# Patient Record
Sex: Male | Born: 1960 | Race: White | Hispanic: No | Marital: Married | Smoking: Never smoker
Health system: Southern US, Community
[De-identification: ages and names within clinical notes are randomized; demographics above are authoritative.]

## PROBLEM LIST (undated history)

## (undated) DIAGNOSIS — G43909 Migraine, unspecified, not intractable, without status migrainosus: Secondary | ICD-10-CM

## (undated) DIAGNOSIS — N400 Enlarged prostate without lower urinary tract symptoms: Secondary | ICD-10-CM

## (undated) DIAGNOSIS — I10 Essential (primary) hypertension: Secondary | ICD-10-CM

## (undated) DIAGNOSIS — L309 Dermatitis, unspecified: Secondary | ICD-10-CM

## (undated) HISTORY — DX: Dermatitis, unspecified: L30.9

## (undated) HISTORY — DX: Migraine, unspecified, not intractable, without status migrainosus: G43.909

## (undated) HISTORY — DX: Benign prostatic hyperplasia without lower urinary tract symptoms: N40.0

## (undated) HISTORY — DX: Essential (primary) hypertension: I10

---

## 2004-11-24 HISTORY — PX: OTHER SURGICAL HISTORY: SHX169

## 2005-11-24 HISTORY — PX: WISDOM TOOTH EXTRACTION: SHX21

## 2007-10-29 ENCOUNTER — Ambulatory Visit: Payer: Self-pay | Admitting: Family Medicine

## 2008-03-30 ENCOUNTER — Ambulatory Visit: Payer: Self-pay | Admitting: Family Medicine

## 2008-05-01 ENCOUNTER — Ambulatory Visit: Payer: Self-pay | Admitting: Family Medicine

## 2008-11-08 ENCOUNTER — Ambulatory Visit: Payer: Self-pay | Admitting: Family Medicine

## 2009-08-15 ENCOUNTER — Ambulatory Visit: Payer: Self-pay | Admitting: Family Medicine

## 2010-10-21 ENCOUNTER — Ambulatory Visit: Payer: Self-pay | Admitting: Family Medicine

## 2011-02-24 ENCOUNTER — Ambulatory Visit (INDEPENDENT_AMBULATORY_CARE_PROVIDER_SITE_OTHER): Payer: BC Managed Care – PPO | Admitting: Family Medicine

## 2011-02-24 DIAGNOSIS — N410 Acute prostatitis: Secondary | ICD-10-CM

## 2011-04-09 ENCOUNTER — Telehealth: Payer: Self-pay | Admitting: Family Medicine

## 2011-04-10 MED ORDER — SULFAMETHOXAZOLE-TRIMETHOPRIM 800-160 MG PO TABS: 1.0000 | ORAL_TABLET | Freq: Two times a day (BID) | ORAL | Status: AC

## 2011-04-10 NOTE — Telephone Encounter (Signed)
He states that he has made no improvement with Cipro. Will switch him to Septra. He will call me in 2 weeks to let me know how he is doing. Explained that this could take longer to clear up.

## 2011-04-23 ENCOUNTER — Telehealth: Payer: Self-pay | Admitting: Family Medicine

## 2011-04-23 ENCOUNTER — Other Ambulatory Visit: Payer: Self-pay

## 2011-04-23 MED ORDER — CIPROFLOXACIN HCL 500 MG PO TABS: 500.0000 mg | ORAL_TABLET | Freq: Two times a day (BID) | ORAL | Status: AC

## 2011-04-23 NOTE — Telephone Encounter (Signed)
Patient not completely over his symptoms, Cipro will be called in

## 2011-04-23 NOTE — Telephone Encounter (Signed)
Called to pt med was called in

## 2011-04-24 ENCOUNTER — Other Ambulatory Visit: Payer: Self-pay | Admitting: Family Medicine

## 2011-04-24 MED ORDER — SULFAMETHOXAZOLE-TRIMETHOPRIM 800-160 MG PO TABS: 1.0000 | ORAL_TABLET | Freq: Two times a day (BID) | ORAL | Status: AC

## 2011-05-08 ENCOUNTER — Encounter: Payer: Self-pay | Admitting: Family Medicine

## 2011-05-08 DIAGNOSIS — G43909 Migraine, unspecified, not intractable, without status migrainosus: Secondary | ICD-10-CM | POA: Insufficient documentation

## 2011-08-05 ENCOUNTER — Ambulatory Visit (INDEPENDENT_AMBULATORY_CARE_PROVIDER_SITE_OTHER): Payer: BC Managed Care – PPO | Admitting: Family Medicine

## 2011-08-05 ENCOUNTER — Encounter: Payer: Self-pay | Admitting: Family Medicine

## 2011-08-05 VITALS — BP 132/88 | HR 63 | Wt 168.0 lb

## 2011-08-05 DIAGNOSIS — M542 Cervicalgia: Secondary | ICD-10-CM

## 2011-08-05 DIAGNOSIS — L259 Unspecified contact dermatitis, unspecified cause: Secondary | ICD-10-CM

## 2011-08-05 NOTE — Patient Instructions (Signed)
If the rash continues on the wrist use clear nail polish on your watch

## 2011-08-05 NOTE — Progress Notes (Signed)
  Subjective:    Patient ID: Tony Hester, male    DOB: November 04, 1961, 50 y.o.   MRN: [id number]  HPI Approximately 3 weeks ago he has noted the intermittent feeling of a cracking sensation in his neck. No associated pain, numbness, tingling, headache He also has a rash present on his left wrist underneath the area where he wears his watch and also erythematous linear lesions distal to that.  Review of Systems     Objective:   Physical Exam Alert and in no distress. Full motion of the neck. No tenderness to palpation. Normal motor, sensory and DTRs. Erythematous rash in the form of his watch his noted on the left wrist as well as linear lesions distal to that.      Assessment & Plan:  The sensation she is having are probably soft tissue in nature. I explained this to him and recommended no intervention. Hytech dermatitis. Recommend conservative care for this and using clear nail polish on the underside of his watch .

## 2011-11-13 ENCOUNTER — Other Ambulatory Visit: Payer: Self-pay | Admitting: Family Medicine

## 2011-12-18 ENCOUNTER — Other Ambulatory Visit: Payer: Self-pay | Admitting: Family Medicine

## 2011-12-18 NOTE — Telephone Encounter (Signed)
Is this ok?

## 2011-12-20 ENCOUNTER — Other Ambulatory Visit: Payer: Self-pay | Admitting: Family Medicine

## 2011-12-22 NOTE — Telephone Encounter (Signed)
Is this ok?

## 2012-02-26 ENCOUNTER — Encounter: Payer: Self-pay | Admitting: Family Medicine

## 2012-02-26 ENCOUNTER — Ambulatory Visit (INDEPENDENT_AMBULATORY_CARE_PROVIDER_SITE_OTHER): Payer: BC Managed Care – PPO | Admitting: Family Medicine

## 2012-02-26 VITALS — BP 148/102 | HR 76 | Temp 99.1°F | Ht 70.0 in | Wt 169.0 lb

## 2012-02-26 DIAGNOSIS — I10 Essential (primary) hypertension: Secondary | ICD-10-CM | POA: Insufficient documentation

## 2012-02-26 DIAGNOSIS — J069 Acute upper respiratory infection, unspecified: Secondary | ICD-10-CM

## 2012-02-26 NOTE — Patient Instructions (Signed)
Avoid decongestants.  Stop the Tussin containing decongestants.  I recommend sinus rinses once or twice daily, Mucinex (plain, NOT D).  Consider Delsym syrup at bedtime if needed for cough, or Mucinex DM twice daily. If fevers persist, or if symptoms persist/worsen, call Monday for an antibiotic (amox or zpak). Drink plenty of fluids

## 2012-02-26 NOTE — Progress Notes (Signed)
Chief complaint: cough and congestion since Sunday-feverish and tired. Clear to yellowish mucus. Wanted to know if he could have rx for phenergan, current is out of date. Had a question re: recent hert flutters, he thinks it may be related to stress. I let him know that he is here for an acute visit and you may or may not be able to address this issue today  HPI: Began with fatigue 5 days ago, then developed congestion, slight sore throat, cough.  Felt feverish 4 days ago, felt better over the last 2 days, until today. Started feeling feverish again today.  Nasal mucus has been clear to slightly yellowish in color.  Denies sinus pain.  Hears a funny sound when he swallows, and notes some PND, but seems a little better than it was earlier.  Dry cough, nonproductive.  Cough is a little worse at night, and notices that sound when he swallows at night, bothers him.  Denies close sick contacts, but works at [employer], and has sick exposures there.  Has been taking a Tussin syrup that contains decongestant, and aspirin. Has been using it every 4 hours, with some temporary improvement.  3-4 weeks ago, heart sometimes feels fluttery, not related to exercise, relieved by taking some deep breaths.  Past Medical History  Diagnosis Date  . Eczema   . Migraine headache   . Hypertension    Current Outpatient Prescriptions on File Prior to Visit  Medication Sig Dispense Refill  . aspirin 81 MG tablet Take 81 mg by mouth daily.        . bisoprolol-hydrochlorothiazide (ZIAC) 5-6.25 MG per tablet TAKE 1 TABLET DAILY  30 tablet  6  . SUMAtriptan (IMITREX) 100 MG tablet TAKE 1 TABLET BY MOUTH DAILY AS NEEDED MAY REPEAT IN 2 HOURS MAX OF 2/DAY  9 tablet  11   Allergies  Allergen Reactions  . Codeine Nausea And Vomiting  . Percogesic Palpitations   History   Social History  . Marital Status: Married    Spouse Name: N/A    Number of Children: N/A  . Years of Education: N/A   Occupational History  . Not on file.    Social History Main Topics  . Smoking status: Never Smoker   . Smokeless tobacco: Never Used  . Alcohol Use: Yes     maybe 2 drinks per month.  . Drug Use: No  . Sexually Active: Not on file   Other Topics Concern  . Not on file   Social History Narrative  . No narrative on file   ROS:  Denies nausea, vomiting, diarrhea, skin rash or other concerns. +palpitation.  See HPI  PHYSICAL EXAM: BP 148/102  Pulse 76  Temp(Src) 99.1 F (37.3 C) (Oral)  Ht 5\' 10"  (1.778 m)  Wt 169 lb (76.658 kg)  BMI 24.25 kg/m2 Well developed, pleasant male in no distress, sounds congested, no coughing HEENT:  PERRL, EOMI, conjunctiva clear.  TM's and EAC's normal.  Nasal mucosa moderately edematous, no erythema, clear mucus, no purulence.  Sinuses nontender,  OP clear without erythema or cobblestoning. Neck: no lymphadenopathy or mass Heart: regular rate and rhythm without murmur Lungs: clear bilaterally Skin: no rash Psych: normal mood, affect  ASSESSMENT/PLAN: 1. URI (upper respiratory infection)   2. Essential hypertension, benign     Avoid decongestants.  Stop the Tussin containing decongestants.  I recommend sinus rinses once or twice daily, Mucinex (plain, NOT D).  Consider Delsym syrup at bedtime if needed for cough, or  Mucinex DM twice daily. If fevers persist, or if symptoms persist/worsen, call Monday for an antibiotic (amox or zpak). Drink plenty of fluids  Discussed stress reduction techniques.  Keep track of when he has symptoms, whether or not there is tachycardia or irregular heart rate.  F/u with Dr. Susann Givens for ongoing concerns

## 2012-03-01 ENCOUNTER — Telehealth: Payer: Self-pay | Admitting: Family Medicine

## 2012-03-01 MED ORDER — AMOXICILLIN 875 MG PO TABS: 875.0000 mg | ORAL_TABLET | Freq: Two times a day (BID) | ORAL | Status: AC

## 2012-03-01 MED ORDER — POLYMYXIN B-TRIMETHOPRIM 10000-0.1 UNIT/ML-% OP SOLN: 1.0000 [drp] | OPHTHALMIC | Status: AC

## 2012-03-01 NOTE — Telephone Encounter (Signed)
Pt was seen by you for flu and was advised if no better today that you would call him in an antibiotic.  He is no better, please call into [facility] at [facility] in [place].    Also, he has discharge in left eye, appears to be pink eye.  Do you need to see him or can you call in something for this also?  Pt phone [phone]

## 2012-03-01 NOTE — Telephone Encounter (Signed)
Advise pt--amoxacillin and eye drop sent to pharmacy.  If it is a viral pinkeye, will get better on its own.  The drops treat bacterial conjunctivitis (ie this means he can still be contagious, even if using drops).  F/u if persistent/worsening symptoms

## 2012-03-01 NOTE — Telephone Encounter (Signed)
Spoke with patient and advised him that abx were called in and to follow up if sxs persist or worsen.

## 2012-03-01 NOTE — Telephone Encounter (Signed)
Sent to [facility] in error needs to go to Dr. Lynelle Doctor

## 2012-03-16 ENCOUNTER — Encounter: Payer: Self-pay | Admitting: Family Medicine

## 2012-03-16 ENCOUNTER — Ambulatory Visit (INDEPENDENT_AMBULATORY_CARE_PROVIDER_SITE_OTHER): Payer: BC Managed Care – PPO | Admitting: Family Medicine

## 2012-03-16 VITALS — BP 130/80 | HR 66 | Temp 98.2°F | Wt 169.0 lb

## 2012-03-16 DIAGNOSIS — R350 Frequency of micturition: Secondary | ICD-10-CM

## 2012-03-16 LAB — POCT URINALYSIS DIPSTICK
Blood, UA: NEGATIVE
Nitrite, UA: NEGATIVE
Protein, UA: NEGATIVE
Spec Grav, UA: 1.015
Urobilinogen, UA: NEGATIVE

## 2012-03-16 NOTE — Patient Instructions (Signed)
Reduce the fluid intake

## 2012-03-16 NOTE — Progress Notes (Signed)
  Subjective:    Patient ID: Tony Hester, male    DOB: 03-23-1961, 51 y.o.   MRN: [id number]  HPI Last night he noted the onset of urinary frequency but no dysuria, urgency or hesitancy. He drinks is normal mouth fluids. He has had no change in his weight.   Review of Systems     Objective:   Physical Exam Alert and in no distress. Abdominal exam shows no masses or tenderness. Urinalysis is negative. Blood sugar is 103.       Assessment & Plan:  Polyuria. Probable secondary to fluid intake. I reassured him that he was not in any danger.

## 2012-06-01 ENCOUNTER — Encounter: Payer: Self-pay | Admitting: Medical

## 2012-06-01 ENCOUNTER — Ambulatory Visit (INDEPENDENT_AMBULATORY_CARE_PROVIDER_SITE_OTHER): Payer: BC Managed Care – PPO | Admitting: Medical

## 2012-06-01 VITALS — BP 140/90 | HR 68 | Temp 98.7°F | Wt 165.0 lb

## 2012-06-01 DIAGNOSIS — R109 Unspecified abdominal pain: Secondary | ICD-10-CM

## 2012-06-01 DIAGNOSIS — R350 Frequency of micturition: Secondary | ICD-10-CM

## 2012-06-01 LAB — POCT URINALYSIS DIPSTICK
Blood, UA: NEGATIVE
Glucose, UA: NEGATIVE
Nitrite, UA: NEGATIVE
Protein, UA: NEGATIVE
Urobilinogen, UA: NEGATIVE
pH, UA: 5

## 2012-06-01 MED ORDER — BISOPROLOL-HYDROCHLOROTHIAZIDE 5-6.25 MG PO TABS: 1.0000 | ORAL_TABLET | Freq: Every day | ORAL | Status: DC

## 2012-06-01 MED ORDER — CIPROFLOXACIN HCL 500 MG PO TABS: 500.0000 mg | ORAL_TABLET | Freq: Two times a day (BID) | ORAL | Status: AC

## 2012-06-01 NOTE — Progress Notes (Signed)
Subjective:   HPI  Tony Hester is a 51 y.o. male who presents with lower abdominal discomfort x 2 weeks.   He reports raw feeling in pelvic area, feels stiff or tight in left lower abdomen.  Having some urinary frequency, burning with urination.  Few years ago had bilat hernia repaired, and at times the scar tissue gets a little irritated.  Thought this was the problem, but now he thinks its something else.  He denies hx/o UTI, but has been treated for prostatitis in the past.  No other aggravating or relieving factors.    No other c/o.  The following portions of the patient's history were reviewed and updated as appropriate: allergies, current medications, past family history, past medical history, past social history, past surgical history and problem list.  Past Medical History  Diagnosis Date  . Eczema   . Migraine headache   . Hypertension     Allergies  Allergen Reactions  . Codeine Nausea And Vomiting  . Percogesic (Diphenhydramine-Acetaminophen)     palpitations  . Diphenhydramine-Acetaminophen Palpitations    Review of Systems Constitutional: -fever, -chills, -sweats, -unexpected -weight change,-fatigue ENT: -runny nose, -ear pain, -sore throat Cardiology:  -chest pain, -palpitations, -edema Respiratory: -cough, -shortness of breath, -wheezing Gastroenterology: +lower abdominal pain, -nausea, -vomiting, -diarrhea, -constipation, no bowel changes Musculoskeletal: +left hip tightness/discomfort, -joint swelling, -back pain Urology: +tingling with urination, +urinary frequency, +urgency, no hematuria, no stream changes, no ejaculate changes Neurology: -headache, -weakness, -tingling, -numbness     Objective:   Physical Exam  General appearance: alert, no distress, WD/WN, lean white male Abdomen: +bs, soft, non tender, non distended, no masses, no hepatomegaly, no splenomegaly Back: nontender, no CVA tenderness GU: normal male external genitalia, bilat inguinal hernia  scars from surgery, no mass, no hernia, nontender Rectal: anus normal, prostate seems boggy, no mass, no nodules  Assessment and Plan :     Encounter Diagnoses  Name Primary?  . Urinary frequency Yes  . Abdominal pain    Urinalysis unremarkable.  Will treat for suspected acute prostatitis with 14 days of Cipro.   Recheck 2wk, sooner prn.

## 2012-06-01 NOTE — Patient Instructions (Signed)
Prostatitis Prostatitis is an inflammation (the body's way of reacting to injury and/or infection) of the prostate gland. The prostate gland is a male organ. The gland is about the size and shape of a walnut. The prostate is located just below the bladder. It produces semen, which is a fluid that helps nourish and transport sperm. Prostatitis is the most common urinary tract problem in men younger than age 52. There are 4 categories of prostatitis:  I - Acute bacterial prostatitis.   II - Chronic bacterial prostatitis.   III - Chronic prostatitis and chronic pelvic pain syndrome (CPPS).   Inflammatory.   Non inflammatory.   IV - Asymptomatic inflammatory prostatitis.  Acute and chronic bacterial prostatitis are problems with bacterial infections of the prostate. "Acute" infection is usually a one-time problem. "Chronic" bacterial prostatitis is a condition with recurrent infection. It is usually caused by the same germ(bacteria). CPPS has symptoms similar to prostate infection. However, no infection is actually found. This condition can cause problems of ongoing pain. Currently, it cannot be cured. Treatments are available and aimed at symptom control.  Asymptomatic inflammatory prostatitis has no symptoms. It is a condition where infection-fighting cells are found by chance in the urine. The diagnosis is made most often during an exam for other conditions. Other conditions could be infertility or a high level of PSA (prostate-specific antigen) in the blood. SYMPTOMS  Symptoms can vary depending upon the type of prostatitis that exists. There can also be overlap in symptoms. This can make diagnosis difficult. Symptoms: For Acute bacterial prostatitis  Painful urination.   Fever or chills.   Muscle or joint pains.   Low back pain.   Low abdominal pain.   Inability to empty bladder completely.   Sudden urges to urinate.   Frequent urination during the day.   Difficulty starting  urine stream.   Need to urinate several times at night (nocturia).   Weak urine stream.   Urethral (tube that carries urine from the bladder out of the body) discharge and dribbling after urination.  For Chronic bacterial prostatitis  Rectal pain.   Pain in the testicles, penis, or tip of the penis.   Pain in the space between the anus and scrotum (perineum).   Low back pain.   Low abdominal pain.   Problems with sexual function.   Painful ejaculation.   Bloody semen.   Inability to empty bladder completely.   Painful urination.   Sudden urges to urinate.   Frequent urination during the day.   Difficulty starting urine stream.   Need to urinate several times at night (nocturia).   Weak urine stream.   Dribbling after urination.   Urethral discharge.  For Chronic prostatitis and chronic pelvic pain syndrome (CPPS) Symptoms are the same as those for chronic bacterial prostatitis. Problems with sexual function are often the reason for seeking care. This important problem should be discussed with your caregiver. For Asymptomatic inflammatory prostatitis As noted above, there are no symptoms with this condition. DIAGNOSIS   Your caregiver may perform a rectal exam. This exam is to determine if the prostate is swollen and tender.   Sometimes blood work is performed. This is done to see if your white blood cell count is elevated. The Prostate Specific Antigen (PSA) is also measured. PSA is a blood test that can help detect early prostate cancer.   A urinalysis is done to find out what type of infection is present if this is a suspected cause. An   additional urinalysis may be done after a digital rectal exam. This is to see if white blood cells are pushed out of the prostate and into the urine. A low-grade infection of the prostate may not be found on the first urinalysis.  In more difficult cases, your caregiver may advise other tests. Tests could include:  Urodynamics  -- Tests the function of the bladder and the organs involved in triggering and controlling normal urination.   Urine flow rate.   Cystoscopy -- In this procedure, a thin, telescope-like tube with a light and tiny camera attached (cystoscope) is inserted into the bladder through the urethra. This allows the caregiver to see the inside of the urethra and bladder.   Electromyography -- This procedure tests how the muscles and nerves of the bladder work. It is focused on the muscles that control the anus and pelvic floor. These are the muscles between the anus and scrotum.  In people who show no signs of infection, certain uncommon infections might be causing constant or recurrent symptoms. These uncommon infections are difficult to detect. More work in medicine may help find solutions to these problems. TREATMENT  Antibiotics are used to treat infections caused by germs. If the infection is not treated and becomes long lasting (chronic), it may become a lower grade infection with minor, continual problems. Without treatment, the prostate may develop a boil or furuncle (abscess). This may require surgical treatment. For those with chronic prostatitis and CPPS, it is important to work closely with your primary caregiver and urologist. For some, the medicines that are used to treat a non-cancerous, enlarged prostate (benign prostatic hypertrophy) may be helpful. Referrals to specialists other than urologists may be necessary. In rare cases when all treatments have been inadequate for pain control, an operation to remove the prostate may be recommended. This is very rare and before this is considered thorough discussion with your urologist is highly recommended.  In cases of secondary to chronic non-bacterial prostatitis, a good relationship with your urologist or primary caregiver is essential because it is often a recurrent prolonged condition that requires a good understanding of the causes and a commitment  to therapy aimed at controlling your symptoms. HOME CARE INSTRUCTIONS   Hot sitz baths for 20 minutes, 4 times per day, may help relieve pain.   Non-prescription pain killers may be used as your caregiver recommends if you have no allergies to them. Some illnesses or conditions prevent use of non-prescription drugs. If unsure, check with your caregiver. Take all medications as directed. Take the antibiotics for the prescribed length of time, even if you are feeling better.  SEEK MEDICAL CARE IF:   You have any worsening of the symptoms that originally brought you to your caregiver.   You have an oral temperature above 102 F (38.9 C).   You experience any side effects from medications prescribed.  SEEK IMMEDIATE MEDICAL CARE IF:   You have an oral temperature above 102 F (38.9 C), not controlled by medicine.   You have pain not relieved with medications.   You develop nausea, vomiting, lightheadedness, or have a fainting episode.   You are unable to urinate.   You pass bloody urine or clots.  Document Released: 11/07/2000 Document Revised: 10/30/2011 Document Reviewed: 10/13/2011 [employer] Patient Information 2012 [employer]. 

## 2012-06-15 ENCOUNTER — Encounter: Payer: Self-pay | Admitting: Medical

## 2012-06-15 ENCOUNTER — Ambulatory Visit (INDEPENDENT_AMBULATORY_CARE_PROVIDER_SITE_OTHER): Payer: BC Managed Care – PPO | Admitting: Medical

## 2012-06-15 VITALS — BP 132/90 | HR 58 | Temp 98.1°F | Resp 16 | Wt 166.0 lb

## 2012-06-15 DIAGNOSIS — N411 Chronic prostatitis: Secondary | ICD-10-CM

## 2012-06-15 LAB — POCT URINALYSIS DIPSTICK
Blood, UA: NEGATIVE
Protein, UA: NEGATIVE
Spec Grav, UA: <=1.005
Urobilinogen, UA: NEGATIVE
pH, UA: 5

## 2012-06-15 MED ORDER — CIPROFLOXACIN HCL 500 MG PO TABS: 500.0000 mg | ORAL_TABLET | Freq: Two times a day (BID) | ORAL | Status: AC

## 2012-06-15 NOTE — Progress Notes (Signed)
Subjective:   HPI  Tony Hester is a 51 y.o. male who presents for recheck on prostatitis.  I saw him 2 wk ago, started on Cipro for suspected chronic prostatitis.  He has had similar symptoms and treated for similar prior in past years.  Had seen urology once for same, but no abnormality or symptom when he went to urology.  He notes that after about 5 days of Cipro, started to see improvement, but then the symptom returned.  Still c/o lower abdominal discomfort, raw feeling in pelvic area, feels stiff or tight in left lower abdomen.  Having some urinary frequency, burning with urination. No other aggravating or relieving factors.    No other c/o.  The following portions of the patient's history were reviewed and updated as appropriate: allergies, current medications, past family history, past medical history, past social history, past surgical history and problem list.  Past Medical History  Diagnosis Date  . Eczema   . Migraine headache   . Hypertension     Allergies  Allergen Reactions  . Codeine Nausea And Vomiting  . Percogesic (Diphenhydramine-Acetaminophen)     palpitations  . Diphenhydramine-Acetaminophen Palpitations    Review of Systems Constitutional: -fever, -chills, -sweats, -unexpected -weight change,-fatigue ENT: -runny nose, -ear pain, -sore throat Cardiology:  -chest pain, -palpitations, -edema Respiratory: -cough, -shortness of breath, -wheezing Gastroenterology: +lower abdominal pain, -nausea, -vomiting, -diarrhea, -constipation, no bowel changes Musculoskeletal: +left hip tightness/discomfort, -joint swelling, -back pain Urology: +tingling with urination, +urinary frequency, +urgency, no hematuria, no stream changes, no ejaculate changes Neurology: -headache, -weakness, -tingling, -numbness     Objective:   Physical Exam  General appearance: alert, no distress, WD/WN, lean white male Abdomen: +bs, soft, non tender, non distended, no masses, no hepatomegaly, no  splenomegaly Back: nontender, no CVA tenderness  Assessment and Plan :     Encounter Diagnosis  Name Primary?  . Chronic prostatitis Yes   Urinalysis unremarkable.  Culture sent.  Will treat for another 14 days of Cipro.   Call report 2wk.  If not improved at that time, consider Bactrim course, PSA, CBC, possibly CMET, and possible imaging to r/o other process.

## 2012-06-29 ENCOUNTER — Telehealth: Payer: Self-pay | Admitting: Medical

## 2012-06-29 NOTE — Telephone Encounter (Signed)
At this point, if not much improved, lets recheck and get some additional labs to further evaluate.

## 2012-06-30 ENCOUNTER — Ambulatory Visit (INDEPENDENT_AMBULATORY_CARE_PROVIDER_SITE_OTHER): Payer: BC Managed Care – PPO | Admitting: Medical

## 2012-06-30 ENCOUNTER — Encounter: Payer: Self-pay | Admitting: Medical

## 2012-06-30 ENCOUNTER — Telehealth: Payer: Self-pay | Admitting: Medical

## 2012-06-30 VITALS — BP 130/80 | HR 60 | Temp 98.2°F | Resp 16 | Wt 168.0 lb

## 2012-06-30 DIAGNOSIS — R109 Unspecified abdominal pain: Secondary | ICD-10-CM

## 2012-06-30 DIAGNOSIS — N419 Inflammatory disease of prostate, unspecified: Secondary | ICD-10-CM

## 2012-06-30 LAB — COMPREHENSIVE METABOLIC PANEL
Albumin: 4.2 g/dL (ref 3.5–5.2)
BUN: 17 mg/dL (ref 6–23)
CO2: 26 mEq/L (ref 19–32)
Calcium: 9.2 mg/dL (ref 8.4–10.5)
Chloride: 106 mEq/L (ref 96–112)
Creat: 0.95 mg/dL (ref 0.50–1.35)
Potassium: 3.9 mEq/L (ref 3.5–5.3)

## 2012-06-30 MED ORDER — POLYETHYLENE GLYCOL 3350 17 GM/SCOOP PO POWD: 17.0000 g | Freq: Every day | ORAL | Status: DC

## 2012-06-30 NOTE — Progress Notes (Signed)
  Subjective:   HPI  Tony Hester is a 51 y.o. male who presents for recheck on prostatitis.  I saw him twice now, and he has completed about a month of Cipro for suspected chronic prostatitis.  He has had similar symptoms and treated for similar prior in past years.  Had seen urology once for same, but no abnormality or symptom when he went to urology.  Still c/o lower abdominal discomfort, raw feeling in pelvic area, feels stiff or tight in left lower abdomen.  Having some urinary frequency, burning with urination. No other aggravating or relieving factors.  Seemed to get completely better last week, then the last 2 days symptoms have returned.  No other c/o.  The following portions of the patient's history were reviewed and updated as appropriate: allergies, current medications, past family history, past medical history, past social history, past surgical history and problem list.  Past Medical History  Diagnosis Date  . Eczema   . Migraine headache   . Hypertension     Allergies  Allergen Reactions  . Codeine Nausea And Vomiting  . Percogesic (Diphenhydramine-Acetaminophen)     palpitations  . Diphenhydramine-Acetaminophen Palpitations    Review of Systems Constitutional: -fever, -chills, -sweats, -unexpected -weight change,-fatigue ENT: -runny nose, -ear pain, -sore throat Cardiology:  -chest pain, -palpitations, -edema Respiratory: -cough, -shortness of breath, -wheezing Gastroenterology: +lower abdominal pain, -nausea, -vomiting, -diarrhea, -constipation, no bowel changes Musculoskeletal: +left hip tightness/discomfort, -joint swelling, -back pain Urology: +tingling with urination, +urinary frequency, +urgency, no hematuria, no stream changes, no ejaculate changes Neurology: -headache, -weakness, -tingling, -numbness     Objective:   Physical Exam  General appearance: alert, no distress, WD/WN, lean white male Abdomen: +bs, soft, non tender, non distended, no masses, no  hepatomegaly, no splenomegaly Back: nontender, no CVA tenderness  Assessment and Plan :     Encounter Diagnoses  Name Primary?  . Abdominal discomfort Yes  . Prostatitis    Labs today to further eval.  Pending labs, consider changing to different antibiotic, imaging or referral.

## 2012-06-30 NOTE — Telephone Encounter (Signed)
Patient was notified of Tony Hester's recommendations and the patient has an appointment for today 06/30/12 to see Tony Hews. CLS

## 2012-07-01 ENCOUNTER — Encounter: Payer: Self-pay | Admitting: Medical

## 2012-07-01 ENCOUNTER — Other Ambulatory Visit: Payer: BC Managed Care – PPO

## 2012-07-01 DIAGNOSIS — R109 Unspecified abdominal pain: Secondary | ICD-10-CM

## 2012-07-01 LAB — CBC WITH DIFFERENTIAL/PLATELET
Basophils Relative: 1 % (ref 0–1)
Eosinophils Absolute: 0.2 10*3/uL (ref 0.0–0.7)
Hemoglobin: 13.8 g/dL (ref 13.0–17.0)
Lymphs Abs: 1.5 10*3/uL (ref 0.7–4.0)
MCHC: 34.9 g/dL (ref 30.0–36.0)
Monocytes Relative: 10 % (ref 3–12)
Neutro Abs: 3 10*3/uL (ref 1.7–7.7)
Neutrophils Relative %: 57 % (ref 43–77)
Platelets: 152 10*3/uL (ref 150–400)
RBC: 4.46 MIL/uL (ref 4.22–5.81)

## 2012-07-01 LAB — PSA: PSA: 0.34 ng/mL (ref <=4.00)

## 2012-07-01 NOTE — Telephone Encounter (Signed)
FYI

## 2012-07-09 ENCOUNTER — Other Ambulatory Visit: Payer: Self-pay | Admitting: Medical

## 2012-07-09 DIAGNOSIS — R102 Pelvic and perineal pain: Secondary | ICD-10-CM

## 2012-07-09 DIAGNOSIS — R109 Unspecified abdominal pain: Secondary | ICD-10-CM

## 2012-07-13 ENCOUNTER — Telehealth: Payer: Self-pay | Admitting: Family Medicine

## 2012-07-13 NOTE — Telephone Encounter (Signed)
PATIENT IS AWARE OF HIS APPOINTMENT AT [facility] ON 07/15/12 @ 930 AM FOR CT ABDOMEN/ PELVIS. AUTH # [id number] EXP- 08/11/12 I FAX EVERYTHING OVER TO [facility]

## 2012-07-15 ENCOUNTER — Ambulatory Visit
Admission: RE | Admit: 2012-07-15 | Discharge: 2012-07-15 | Disposition: A | Discharge status: Home / Self Care | Payer: BC Managed Care – PPO | Source: Ambulatory Visit | Attending: Medical | Admitting: Medical

## 2012-07-15 DIAGNOSIS — R109 Unspecified abdominal pain: Secondary | ICD-10-CM

## 2012-07-15 DIAGNOSIS — R102 Pelvic and perineal pain: Secondary | ICD-10-CM

## 2012-07-15 MED ORDER — IOHEXOL 300 MG/ML  SOLN
100.0000 mL | Freq: Once | INTRAMUSCULAR | Status: AC | PRN
  Administered 2012-07-15: 100 mL via INTRAVENOUS

## 2012-07-19 ENCOUNTER — Encounter: Payer: Self-pay | Admitting: Internal Medicine

## 2012-07-21 ENCOUNTER — Telehealth: Payer: Self-pay | Admitting: Family Medicine

## 2012-07-21 NOTE — Telephone Encounter (Signed)
Patient was notified of his 2 appointment by phone I informed the patient that I mailed him copies with all of the appointment information on the time, date, phone # and addresses. CLS    [facility] 08/12/12 @ 900 AM- NURSE VISIT 08/26/12 @ 800 AM - [place] [phone]   [facility] 08/03/12 @ 845 WITH DR. [facility] [phone]

## 2012-07-23 ENCOUNTER — Encounter: Payer: Self-pay | Admitting: Medical

## 2012-07-23 ENCOUNTER — Ambulatory Visit (INDEPENDENT_AMBULATORY_CARE_PROVIDER_SITE_OTHER): Payer: BC Managed Care – PPO | Admitting: Medical

## 2012-07-23 VITALS — BP 150/90 | HR 60 | Temp 98.5°F | Resp 16 | Wt 167.0 lb

## 2012-07-23 DIAGNOSIS — R102 Pelvic and perineal pain: Secondary | ICD-10-CM

## 2012-07-23 DIAGNOSIS — R300 Dysuria: Secondary | ICD-10-CM

## 2012-07-23 DIAGNOSIS — R110 Nausea: Secondary | ICD-10-CM

## 2012-07-23 DIAGNOSIS — R109 Unspecified abdominal pain: Secondary | ICD-10-CM

## 2012-07-23 LAB — POCT URINALYSIS DIPSTICK
Bilirubin, UA: NEGATIVE
Blood, UA: NEGATIVE
Glucose, UA: NEGATIVE
Leukocytes, UA: NEGATIVE
Nitrite, UA: NEGATIVE
Urobilinogen, UA: NEGATIVE

## 2012-07-23 MED ORDER — PROMETHAZINE HCL 25 MG PO TABS: 25.0000 mg | ORAL_TABLET | Freq: Four times a day (QID) | ORAL | Status: DC | PRN

## 2012-07-23 NOTE — Progress Notes (Signed)
  Subjective:   HPI  Tony Hester is a 51 y.o. male who presents for recheck.  I've seen him recently for what appeared to be prostatitis or vague pelvic pain.  He went through 1 month of Cipro, had CT abdomen and pelvis, labs.  So far no specific cause found.  He has been referred for first screening colonoscopy and urology consult.  He is pending both.  He works at [employer] and school just started back.  Here for one day hx/o malaise.  This morning awoke with nausea, a little dizzy, and felt a little ill this morning.   Had urgency of urine and tingling this morning.  Usually doesn't get nausea unless migraine, but no recent headaches.  Denies abdominal pain.  No bowel c/o.  No blood, odor, or cloudy urine.  No scrotal or perineal pain, but has generic lower abdominal pain.  Denies other symptoms.  Denies URI symptoms.  The following portions of the patient's history were reviewed and updated as appropriate: allergies, current medications, past family history, past medical history, past social history, past surgical history and problem list.  Past Medical History  Diagnosis Date  . Eczema   . Migraine headache   . Hypertension     Allergies  Allergen Reactions  . Codeine Nausea And Vomiting  . Percogesic (Diphenhydramine-Acetaminophen)     palpitations  . Diphenhydramine-Acetaminophen Palpitations    Review of Systems Constitutional: -fever, -chills, -sweats, -unexpected -weight change,+fatigue ENT: -runny nose, -ear pain, -sore throat Cardiology:  -chest pain, -palpitations, -edema Respiratory: -cough, -shortness of breath, -wheezing Gastroenterology: +lower abdominal pain, +nausea, -vomiting, -diarrhea, -constipation, no bowel changes Urology: +tingling with urination, +urinary frequency, +urgency, no hematuria, no stream changes, no ejaculate changes Neurology: -headache, -weakness, -tingling, -numbness     Objective:   Physical Exam  General appearance: alert, no distress,  WD/WN HEENT: normocephalic, sclerae anicteric, TMs pearly, nares patent, no discharge or erythema, pharynx normal Oral cavity: MMM, no lesions Neck: supple, no lymphadenopathy, no thyromegaly, no masses Heart: RRR, normal S1, S2, no murmurs Lungs: CTA bilaterally, no wheezes, rhonchi, or rales Abdomen: +bs, soft, non tender, non distended, no masses, no hepatomegaly, no splenomegaly Pulses: 2+ symmetric, upper and lower extremities, normal cap refill GU: normal external genitalia, bilat inguinal hernia surgery scars, unremarkable exam   Assessment and Plan :     Encounter Diagnoses  Name Primary?  . Nausea Yes  . Pelvic pain in male   . Dysuria    Reviewed his recent labs, CT scan, treatment regimen.  At this point etiology is unclear.  Script for phenergan prn, rest, hydrate well.  F/u for colonoscopy and Urology consults.  Call/return if symptoms change or worsen.  Current symptoms may just be viral syndrome.

## 2012-08-06 ENCOUNTER — Telehealth: Payer: Self-pay | Admitting: Internal Medicine

## 2012-08-06 NOTE — Telephone Encounter (Signed)
Refer to dermatology for skin concern

## 2012-08-06 NOTE — Telephone Encounter (Signed)
Pt has a spot on ear seems not to getting any better and would like to get referred to a dermatology.

## 2012-08-09 ENCOUNTER — Telehealth: Payer: Self-pay | Admitting: Family Medicine

## 2012-08-09 NOTE — Telephone Encounter (Signed)
I left a detailed message about this patients up coming appointment.  [facility] [phone] 08/27/12 @ 940 am with Harriette Ohara.CLS

## 2012-08-09 NOTE — Telephone Encounter (Signed)
Patient is aware of his appointment at [facility] On 08/27/12 @ 940 am with Harriette Ohara PA-C [phone]. CLS

## 2012-08-12 ENCOUNTER — Ambulatory Visit (AMBULATORY_SURGERY_CENTER): Payer: BC Managed Care – PPO | Admitting: *Deleted

## 2012-08-12 ENCOUNTER — Encounter: Payer: Self-pay | Admitting: Internal Medicine

## 2012-08-12 VITALS — Ht 70.0 in | Wt 169.2 lb

## 2012-08-12 DIAGNOSIS — Z1211 Encounter for screening for malignant neoplasm of colon: Secondary | ICD-10-CM

## 2012-08-12 MED ORDER — NA SULFATE-K SULFATE-MG SULF 17.5-3.13-1.6 GM/177ML PO SOLN: ORAL | Status: DC

## 2012-08-26 ENCOUNTER — Encounter: Payer: Self-pay | Admitting: Internal Medicine

## 2012-08-26 ENCOUNTER — Ambulatory Visit (AMBULATORY_SURGERY_CENTER): Payer: BC Managed Care – PPO | Admitting: Internal Medicine

## 2012-08-26 VITALS — BP 161/98 | HR 77 | Temp 98.6°F | Resp 16 | Ht 70.0 in | Wt 169.0 lb

## 2012-08-26 DIAGNOSIS — D126 Benign neoplasm of colon, unspecified: Secondary | ICD-10-CM

## 2012-08-26 DIAGNOSIS — K635 Polyp of colon: Secondary | ICD-10-CM

## 2012-08-26 DIAGNOSIS — Z1211 Encounter for screening for malignant neoplasm of colon: Secondary | ICD-10-CM

## 2012-08-26 MED ORDER — SODIUM CHLORIDE 0.9 % IV SOLN: 500.0000 mL | INTRAVENOUS | Status: DC

## 2012-08-26 NOTE — Progress Notes (Signed)
Patient did not experience any of the following events: a burn prior to discharge; a fall within the facility; wrong site/side/patient/procedure/implant event; or a hospital transfer or hospital admission upon discharge from the facility. (G8907) Patient did not have preoperative order for IV antibiotic SSI prophylaxis. (G8918)  

## 2012-08-26 NOTE — Patient Instructions (Addendum)
Handouts were given to your care partner on diverticulosis, high fiber diet, polyps, and hemorrhoids.  You may resume your current medications today.  Please call if any questions or concerns.      YOU HAD AN ENDOSCOPIC PROCEDURE TODAY AT THE [facility]: Refer to the procedure report that was given to you for any specific questions about what was found during the examination.  If the procedure report does not answer your questions, please call your gastroenterologist to clarify.  If you requested that your care partner not be given the details of your procedure findings, then the procedure report has been included in a sealed envelope for you to review at your convenience later.  YOU SHOULD EXPECT: Some feelings of bloating in the abdomen. Passage of more gas than usual.  Walking can help get rid of the air that was put into your GI tract during the procedure and reduce the bloating. If you had a lower endoscopy (such as a colonoscopy or flexible sigmoidoscopy) you may notice spotting of blood in your stool or on the toilet paper. If you underwent a bowel prep for your procedure, then you may not have a normal bowel movement for a few days.  DIET: Your first meal following the procedure should be a light meal and then it is ok to progress to your normal diet.  A half-sandwich or bowl of soup is an example of a good first meal.  Heavy or fried foods are harder to digest and may make you feel nauseous or bloated.  Likewise meals heavy in dairy and vegetables can cause extra gas to form and this can also increase the bloating.  Drink plenty of fluids but you should avoid alcoholic beverages for 24 hours.  ACTIVITY: Your care partner should take you home directly after the procedure.  You should plan to take it easy, moving slowly for the rest of the day.  You can resume normal activity the day after the procedure however you should NOT DRIVE or use heavy machinery for 24 hours (because of the  sedation medicines used during the test).    SYMPTOMS TO REPORT IMMEDIATELY: A gastroenterologist can be reached at any hour.  During normal business hours, 8:30 AM to 5:00 PM Monday through Friday, call [phone].  After hours and on weekends, please call the GI answering service at [phone] who will take a message and have the physician on call contact you.   Following lower endoscopy (colonoscopy or flexible sigmoidoscopy):  Excessive amounts of blood in the stool  Significant tenderness or worsening of abdominal pains  Swelling of the abdomen that is new, acute  Fever of 100F or higher    FOLLOW UP: If any biopsies were taken you will be contacted by phone or by letter within the next 1-3 weeks.  Call your gastroenterologist if you have not heard about the biopsies in 3 weeks.  Our staff will call the home number listed on your records the next business day following your procedure to check on you and address any questions or concerns that you may have at that time regarding the information given to you following your procedure. This is a courtesy call and so if there is no answer at the home number and we have not heard from you through the emergency physician on call, we will assume that you have returned to your regular daily activities without incident.  SIGNATURES/CONFIDENTIALITY: You and/or your care partner have signed paperwork which will  be entered into your electronic medical record.  These signatures attest to the fact that that the information above on your After Visit Summary has been reviewed and is understood.  Full responsibility of the confidentiality of this discharge information lies with you and/or your care-partner.

## 2012-08-26 NOTE — Progress Notes (Signed)
No complaints noted in the recovery room. Maw   

## 2012-08-26 NOTE — Op Note (Signed)
[facility] [address] [place], [address]   COLONOSCOPY PROCEDURE REPORT  PATIENT: Tony Hester, Tony Hester  MR#: [id number] BIRTHDATE: 02/01/1961 , 51  yrs. old GENDER: Male ENDOSCOPIST: Beverley Fiedler, MD REFERRED [id number], John PROCEDURE DATE:  08/26/2012 PROCEDURE:   Colonoscopy with cold biopsy polypectomy ASA CLASS:   Class II INDICATIONS:average risk screening and first colonoscopy. MEDICATIONS: MAC sedation, administered by CRNA and propofol (Diprivan) 250mg  IV  DESCRIPTION OF PROCEDURE:   After the risks benefits and alternatives of the procedure were thoroughly explained, informed consent was obtained.  A digital rectal exam revealed no rectal mass.   The LB CF-[id number]  endoscope was introduced through the anus and advanced to the cecum, which was identified by both the appendix and ileocecal valve. No adverse events experienced. The quality of the prep was Suprep good  The instrument was then slowly withdrawn as the colon was fully examined.   COLON FINDINGS: A sessile polyp measuring 3 mm in size was found in the rectum.  A polypectomy was performed with cold forceps.  The resection was complete and the polyp tissue was completely retrieved.   Moderate diverticulosis was noted at the cecum, in the ascending colon, descending colon, and sigmoid colon.   Moderate sized internal hemorrhoids were found.  Retroflexed views revealed internal hemorrhoids. The time to cecum=5 minutes 02 seconds. Withdrawal time=10 minutes 09 seconds.  The scope was withdrawn and the procedure completed. COMPLICATIONS: There were no complications.  ENDOSCOPIC IMPRESSION: 1.   Sessile polyp measuring 3 mm in size was found in the rectum; polypectomy was performed with cold forceps 2.   Moderate diverticulosis was noted at the cecum, in the ascending colon, descending colon, and sigmoid colon 3.   Moderate sized internal hemorrhoids  RECOMMENDATIONS: 1.  Await pathology  results 2.  High fiber diet 3.  If the polyp removed today is proven to be an adenomatous (pre-cancerous) polyp, you will need a repeat colonoscopy in 5 years.  Otherwise you should continue to follow colorectal cancer screening guidelines for "routine risk" patients with colonoscopy in 10 years.  You will receive a letter within 1-2 weeks with the results of your biopsy as well as final recommendations.  Please call my office if you have not received a letter after 3 weeks.  eSigned:  Beverley Fiedler, MD 08/26/2012 8:47 AM cc: Sharlot Gowda, MD and The Patient

## 2012-08-27 ENCOUNTER — Telehealth: Payer: Self-pay | Admitting: *Deleted

## 2012-08-27 NOTE — Telephone Encounter (Signed)
  Follow up Call-  Call back number 08/26/2012  Post procedure Call Back phone  # [phone]  Permission to leave phone message Yes     Patient questions:  Do you have a fever, pain , or abdominal swelling? no Pain Score  0 *  Have you tolerated food without any problems? yes  Have you been able to return to your normal activities? yes  Do you have any questions about your discharge instructions: Diet   no Medications  no Follow up visit  no  Do you have questions or concerns about your Care? no  Actions: * If pain score is 4 or above: No action needed, pain <4.

## 2012-09-01 ENCOUNTER — Encounter: Payer: Self-pay | Admitting: Internal Medicine

## 2012-09-14 ENCOUNTER — Telehealth: Payer: Self-pay | Admitting: Internal Medicine

## 2012-09-14 MED ORDER — BISOPROLOL-HYDROCHLOROTHIAZIDE 5-6.25 MG PO TABS: 1.0000 | ORAL_TABLET | Freq: Every day | ORAL | Status: DC

## 2012-09-14 NOTE — Telephone Encounter (Signed)
RX was sent to the patients pharmacy. CLS

## 2012-12-09 ENCOUNTER — Ambulatory Visit (INDEPENDENT_AMBULATORY_CARE_PROVIDER_SITE_OTHER): Payer: BC Managed Care – PPO | Admitting: Family Medicine

## 2012-12-09 ENCOUNTER — Encounter: Payer: Self-pay | Admitting: Family Medicine

## 2012-12-09 VITALS — BP 122/80 | HR 66 | Ht 70.0 in | Wt 172.0 lb

## 2012-12-09 DIAGNOSIS — Z0000 Encounter for general adult medical examination without abnormal findings: Secondary | ICD-10-CM

## 2012-12-09 DIAGNOSIS — G43909 Migraine, unspecified, not intractable, without status migrainosus: Secondary | ICD-10-CM

## 2012-12-09 DIAGNOSIS — I10 Essential (primary) hypertension: Secondary | ICD-10-CM

## 2012-12-09 LAB — POCT URINALYSIS DIPSTICK
Bilirubin, UA: NEGATIVE
Glucose, UA: NEGATIVE
Leukocytes, UA: NEGATIVE
Nitrite, UA: NEGATIVE
Urobilinogen, UA: NEGATIVE

## 2012-12-09 LAB — LIPID PANEL
Cholesterol: 167 mg/dL (ref 0–200)
Total CHOL/HDL Ratio: 3.2 Ratio
Triglycerides: 53 mg/dL (ref <150)
VLDL: 11 mg/dL (ref 0–40)

## 2012-12-09 MED ORDER — BISOPROLOL-HYDROCHLOROTHIAZIDE 5-6.25 MG PO TABS: 1.0000 | ORAL_TABLET | Freq: Every day | ORAL | Status: DC

## 2012-12-09 MED ORDER — SUMATRIPTAN SUCCINATE 100 MG PO TABS: 100.0000 mg | ORAL_TABLET | ORAL | Status: DC | PRN

## 2012-12-09 NOTE — Progress Notes (Signed)
  Subjective:    Patient ID: Tony Hester, male    DOB: [birth year], 52 y.o.   MRN: [id number]  HPI He is here for complete examination. He has no particular concerns or complaints. He does have underlying migraines and gets 2 or 3 of these per month. He responds quite nicely to sumatriptan and would like a refill on this medication. He continues on his blood pressure medications. His work and home life are unchanged. He has seen urology in the past and has had difficulty with prostate related symptoms but these are under good control. He has had a colonoscopy. His immunizations are up to date.   Review of Systems  Constitutional: Negative.   HENT: Negative.   Eyes: Negative.   Respiratory: Negative.   Cardiovascular: Negative.   Gastrointestinal: Negative.   Genitourinary: Negative.   Musculoskeletal: Negative.   Skin: Negative.   Neurological: Negative.   Hematological: Negative.   Psychiatric/Behavioral: Negative.        Objective:   Physical Exam BP 122/80  Pulse 66  Ht 5\' 10"  (1.778 m)  Wt 172 lb (78.019 kg)  BMI 24.68 kg/m2  SpO2 97%  General Appearance:    Alert, cooperative, no distress, appears stated age  Head:    Normocephalic, without obvious abnormality, atraumatic  Eyes:    PERRL, conjunctiva/corneas clear, EOM's intact, fundi    benign  Ears:    Normal TM's and external ear canals  Nose:   Nares normal, mucosa normal, no drainage or sinus   tenderness  Throat:   Lips, mucosa, and tongue normal; teeth and gums normal  Neck:   Supple, no lymphadenopathy;  thyroid:  no   enlargement/tenderness/nodules; no carotid   bruit or JVD  Back:    Spine nontender, no curvature, ROM normal, no CVA     tenderness  Lungs:     Clear to auscultation bilaterally without wheezes, rales or     ronchi; respirations unlabored  Chest Wall:    No tenderness or deformity   Heart:    Regular rate and rhythm, S1 and S2 normal, no murmur, rub   or gallop  Breast Exam:    No chest wall  tenderness, masses or gynecomastia  Abdomen:     Soft, non-tender, nondistended, normoactive bowel sounds,    no masses, no hepatosplenomegaly  Genitalia:   deferred   Rectal:   deferred   Extremities:   No clubbing, cyanosis or edema  Pulses:   2+ and symmetric all extremities  Skin:   Skin color, texture, turgor normal, no rashes or lesions  Lymph nodes:   Cervical, supraclavicular, and axillary nodes normal  Neurologic:   CNII-XII intact, normal strength, sensation and gait; reflexes 2+ and symmetric throughout          Psych:   Normal mood, affect, hygiene and grooming.           Assessment & Plan:   1. Essential hypertension, benign  POCT Urinalysis Dipstick, bisoprolol-hydrochlorothiazide (ZIAC) 5-6.25 MG per tablet, DISCONTINUED: bisoprolol-hydrochlorothiazide (ZIAC) 5-6.25 MG per tablet  2. Migraine headache  SUMAtriptan (IMITREX) 100 MG tablet  3. Routine general medical examination at a health care facility  Lipid panel

## 2012-12-13 NOTE — Progress Notes (Signed)
Quick Note:  CALLED PT HOME# TO INFORM HIM OF HIS LABS LOOK GOOD LEFT MESSAGE ______

## 2013-01-31 ENCOUNTER — Other Ambulatory Visit: Payer: Self-pay | Admitting: Family Medicine

## 2014-01-08 IMAGING — CT CT ABD-PELV W/ CM
2 of 5 series · 17 of 46 positions shown, 19 images · IV contrast (READICAT/WATER & [ID] OMNI 300)
Comparison: None.

CLINICAL DATA: 1 month history of abdominal and pelvic pain,
history of bilateral inguinal hernia repair of 1880

CT ABDOMEN AND PELVIS WITH CONTRAST
TECHNIQUE: Multidetector CT imaging of the abdomen and pelvis was
performed following the standard protocol during bolus
administration of intravenous contrast.
Contrast: 100mL OMNIPAQUE IOHEXOL 300 MG/ML  SOLN

[Series 101: abd/pelvis with · axial · 0.70mm/px · z∈[-404,-25]mm · 14 of 86 slices shown, 16 images]
[im 5/86  soft-tissue]
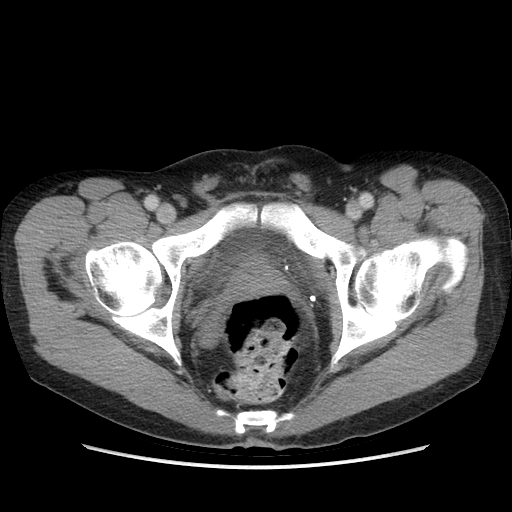
[im 5/86  bone]
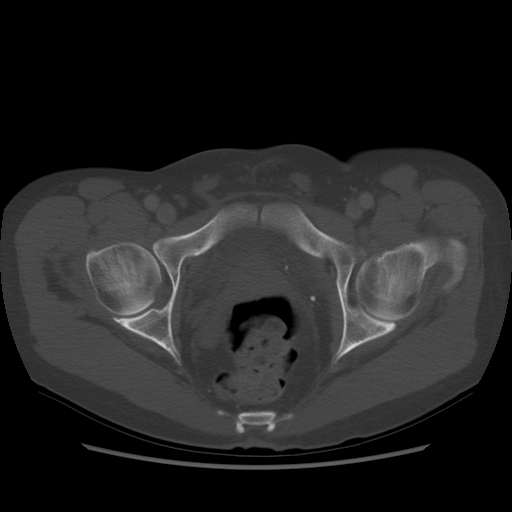
[im 10/86  soft-tissue]
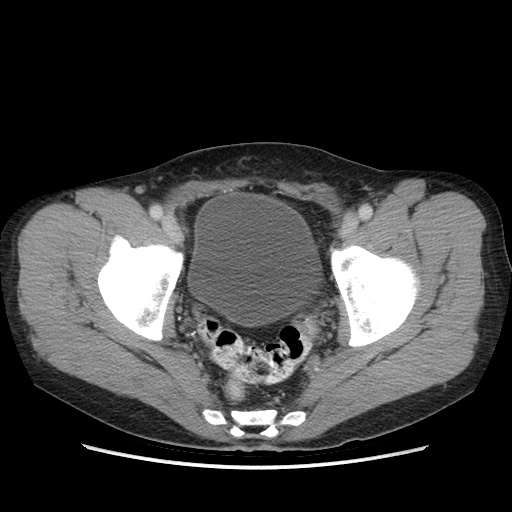
[im 19/86  soft-tissue]
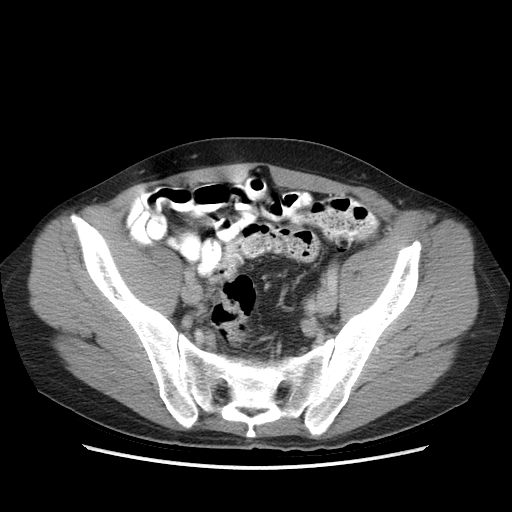
[im 24/86  soft-tissue]
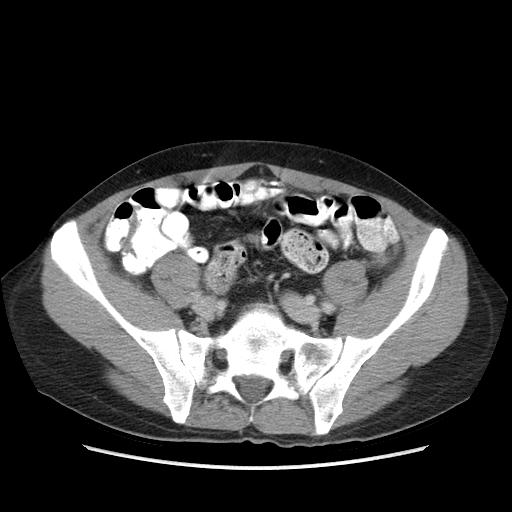
[im 29/86  soft-tissue]
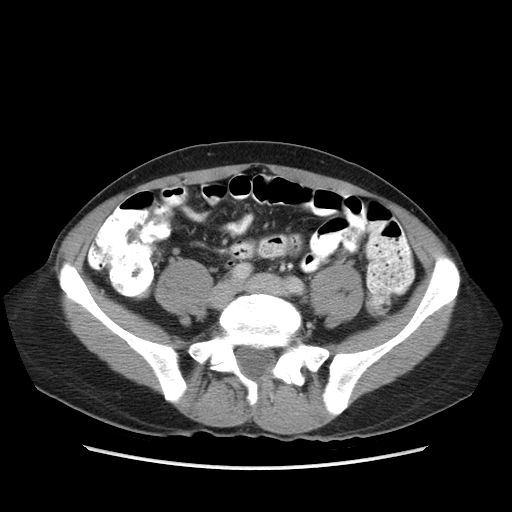
[im 34/86  soft-tissue]
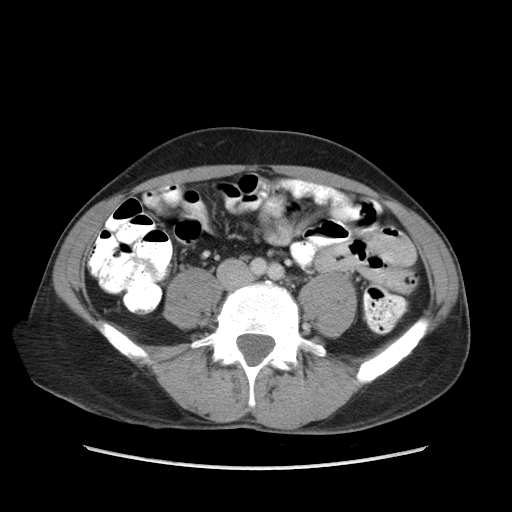
[im 38/86  soft-tissue]
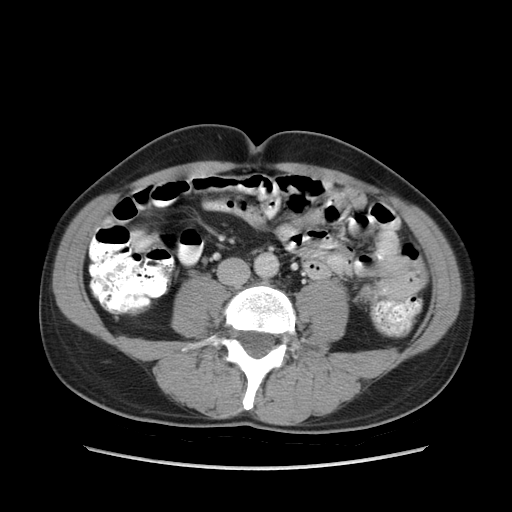
[im 48/86  soft-tissue]
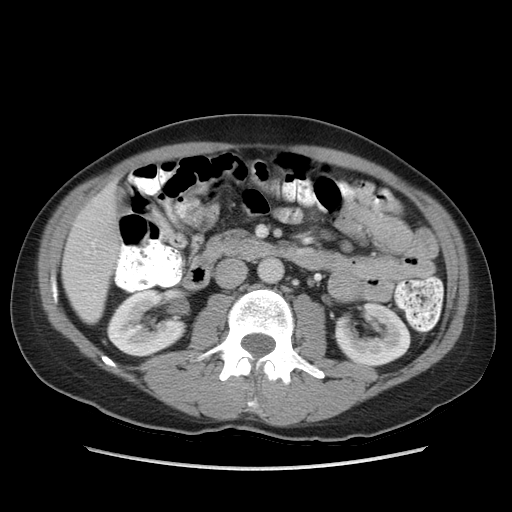
[im 52/86  soft-tissue]
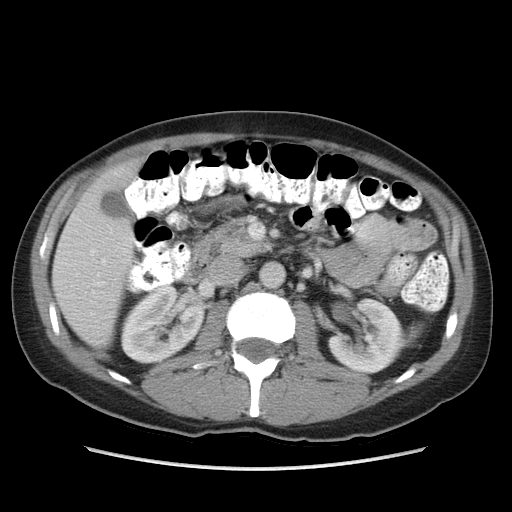
[im 52/86  bone]
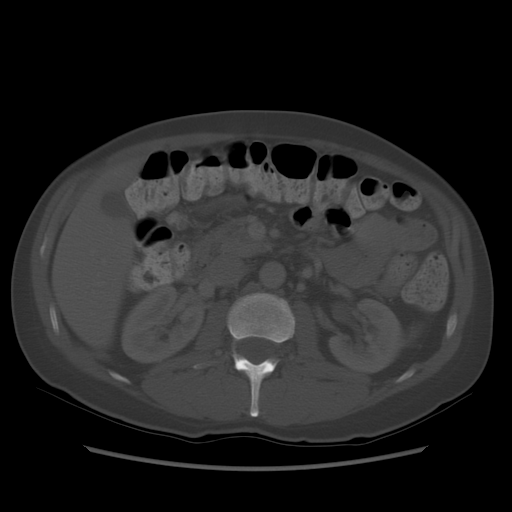
[im 57/86  soft-tissue]
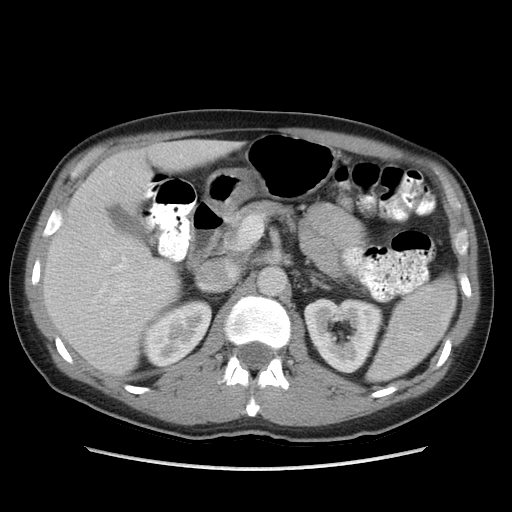
[im 62/86  soft-tissue]
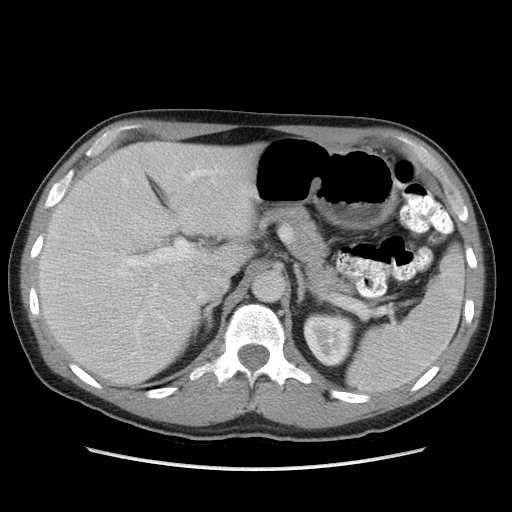
[im 67/86  soft-tissue]
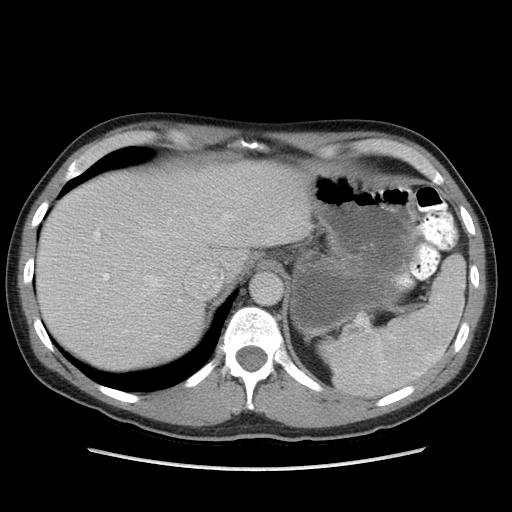
[im 76/86  soft-tissue]
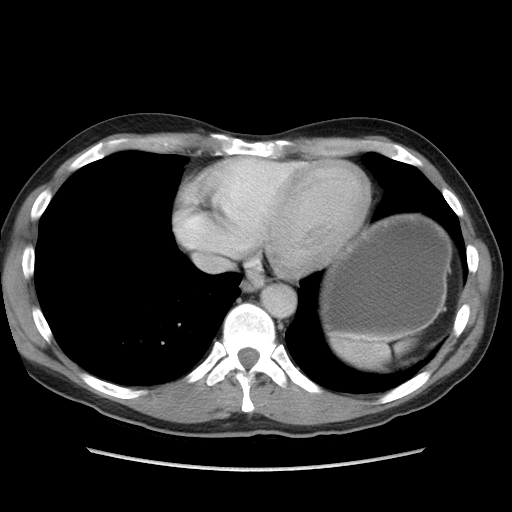
[im 81/86  soft-tissue]
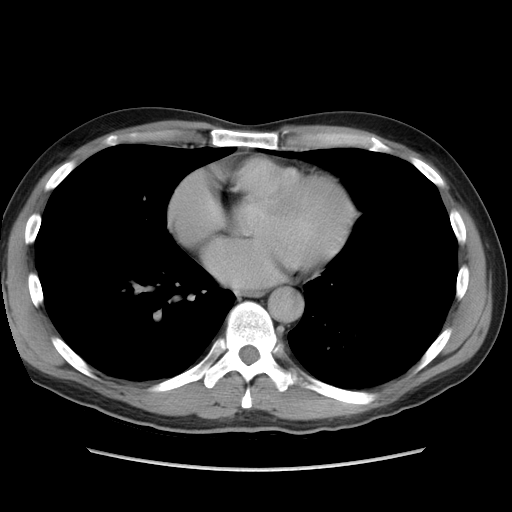

[Series 102: coronal · coronal · 1.00mm/px · 3 of 109 slices shown]
[im 37/109  soft-tissue]
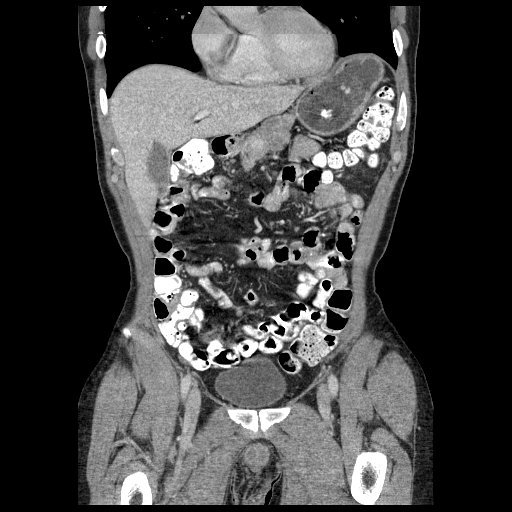
[im 49/109  soft-tissue]
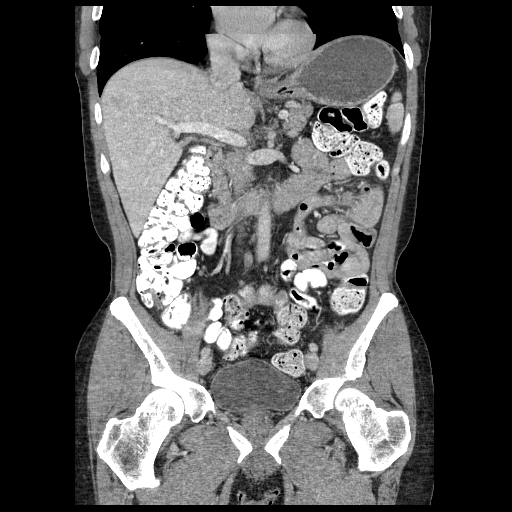
[im 61/109  soft-tissue]
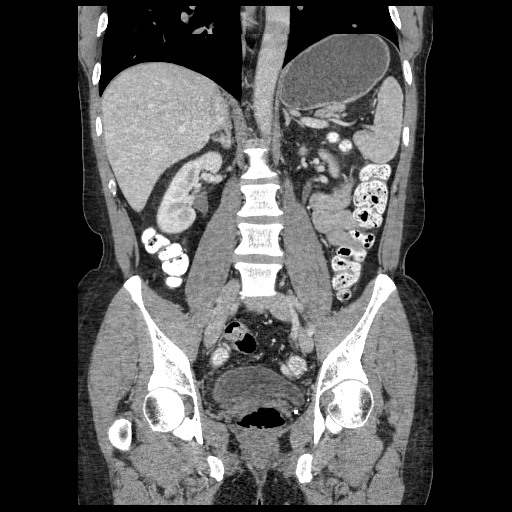

[17 of 46 positions shown; findings below may reference images not displayed]

FINDINGS: The lung bases are clear.  The heart is borderline
enlarged.  The liver enhances with no focal abnormality and no
ductal dilatation is seen.  No calcified gallstones are noted
within the contracted gallbladder.  The pancreas is normal in size
and the pancreatic duct is not dilated.  The adrenal glands and
spleen are unremarkable.  The stomach is moderately fluid distended
with no abnormality noted.  The kidneys enhance with no calculus or
mass and no hydronephrosis is seen.  The abdominal aorta is normal
in caliber.  The origins of the mesenteric vasculature are patent.
No adenopathy is seen.

The urinary bladder is moderately urine distended with no
abnormality noted.  The prostate is normal in size for age.  The
terminal ileum and the appendix are unremarkable.  The small bowel
is not distended.  No bony abnormality is seen
IMPRESSION: .
1.  No explanation for the patient's pain is seen.
2.  The terminal ileum and appendix appear normal.
3.  Borderline cardiomegaly.

## 2014-01-27 ENCOUNTER — Other Ambulatory Visit: Payer: Self-pay | Admitting: Family Medicine

## 2014-01-28 ENCOUNTER — Other Ambulatory Visit: Payer: Self-pay | Admitting: Family Medicine

## 2014-01-30 NOTE — Telephone Encounter (Signed)
Is this ok to refill?  

## 2014-01-30 NOTE — Telephone Encounter (Signed)
He has not been here in over a year and needs a med check appointment.

## 2014-01-30 NOTE — Telephone Encounter (Signed)
Forwarding to you :)

## 2014-01-30 NOTE — Telephone Encounter (Signed)
Is this okay to refill? 

## 2014-01-31 NOTE — Telephone Encounter (Signed)
Left VM for pt Tony Hester

## 2014-02-01 NOTE — Telephone Encounter (Signed)
Pt notified and scheduled for 02/14/14

## 2014-02-14 ENCOUNTER — Ambulatory Visit (INDEPENDENT_AMBULATORY_CARE_PROVIDER_SITE_OTHER): Payer: BC Managed Care – PPO | Admitting: Family Medicine

## 2014-02-14 ENCOUNTER — Encounter: Payer: Self-pay | Admitting: Family Medicine

## 2014-02-14 VITALS — BP 120/82 | HR 60 | Wt 149.0 lb

## 2014-02-14 DIAGNOSIS — N400 Enlarged prostate without lower urinary tract symptoms: Secondary | ICD-10-CM

## 2014-02-14 DIAGNOSIS — L259 Unspecified contact dermatitis, unspecified cause: Secondary | ICD-10-CM

## 2014-02-14 DIAGNOSIS — L309 Dermatitis, unspecified: Secondary | ICD-10-CM

## 2014-02-14 DIAGNOSIS — G43909 Migraine, unspecified, not intractable, without status migrainosus: Secondary | ICD-10-CM

## 2014-02-14 DIAGNOSIS — Z79899 Other long term (current) drug therapy: Secondary | ICD-10-CM

## 2014-02-14 DIAGNOSIS — I10 Essential (primary) hypertension: Secondary | ICD-10-CM

## 2014-02-14 LAB — COMPREHENSIVE METABOLIC PANEL
ALBUMIN: 4.6 g/dL (ref 3.5–5.2)
ALT: 31 U/L (ref 0–53)
AST: 24 U/L (ref 0–37)
Alkaline Phosphatase: 49 U/L (ref 39–117)
BUN: 15 mg/dL (ref 6–23)
CALCIUM: 9.5 mg/dL (ref 8.4–10.5)
CHLORIDE: 103 meq/L (ref 96–112)
CO2: 28 meq/L (ref 19–32)
Creat: 0.94 mg/dL (ref 0.50–1.35)
Glucose, Bld: 92 mg/dL (ref 70–99)
POTASSIUM: 4.1 meq/L (ref 3.5–5.3)
Sodium: 141 mEq/L (ref 135–145)
Total Bilirubin: 0.9 mg/dL (ref 0.2–1.2)
Total Protein: 6.5 g/dL (ref 6.0–8.3)

## 2014-02-14 LAB — LIPID PANEL
CHOLESTEROL: 159 mg/dL (ref 0–200)
HDL: 65 mg/dL (ref >39)
LDL CALC: 82 mg/dL (ref 0–99)
Total CHOL/HDL Ratio: 2.4 Ratio
Triglycerides: 61 mg/dL (ref <150)
VLDL: 12 mg/dL (ref 0–40)

## 2014-02-14 LAB — CBC WITH DIFFERENTIAL/PLATELET
BASOS ABS: 0 10*3/uL (ref 0.0–0.1)
Basophils Relative: 1 % (ref 0–1)
EOS PCT: 3 % (ref 0–5)
Eosinophils Absolute: 0.1 10*3/uL (ref 0.0–0.7)
HEMATOCRIT: 41.4 % (ref 39.0–52.0)
Hemoglobin: 14.8 g/dL (ref 13.0–17.0)
LYMPHS ABS: 1.1 10*3/uL (ref 0.7–4.0)
LYMPHS PCT: 28 % (ref 12–46)
MCH: 31.8 pg (ref 26.0–34.0)
MCHC: 35.7 g/dL (ref 30.0–36.0)
MCV: 88.8 fL (ref 78.0–100.0)
MONO ABS: 0.3 10*3/uL (ref 0.1–1.0)
Monocytes Relative: 8 % (ref 3–12)
NEUTROS ABS: 2.5 10*3/uL (ref 1.7–7.7)
Neutrophils Relative %: 60 % (ref 43–77)
PLATELETS: 155 10*3/uL (ref 150–400)
RBC: 4.66 MIL/uL (ref 4.22–5.81)
RDW: 13 % (ref 11.5–15.5)
WBC: 4.1 10*3/uL (ref 4.0–10.5)

## 2014-02-14 MED ORDER — TRIAMCINOLONE ACETONIDE 0.1 % EX CREA: 1.0000 "application " | TOPICAL_CREAM | Freq: Two times a day (BID) | CUTANEOUS | Status: DC

## 2014-02-14 MED ORDER — SUMATRIPTAN SUCCINATE 100 MG PO TABS: ORAL_TABLET | ORAL | Status: DC

## 2014-02-14 MED ORDER — BISOPROLOL-HYDROCHLOROTHIAZIDE 5-6.25 MG PO TABS: 1.0000 | ORAL_TABLET | Freq: Every day | ORAL | Status: DC

## 2014-02-14 NOTE — Progress Notes (Signed)
   Subjective:    Patient ID: Tony Hester, male    DOB: 11-17-1961, 53 y.o.   MRN: [id number]  HPI He is here for medication check. He does have BPH and is being followed by urology for this. He continues on finasteride and Flomax which seems to be helping. He continues on his blood pressure medication and is having no difficulty with this. He does have underlying migraine headaches and gets good relief with Imitrex. He is a very good feel for when he has a regular headache versus migraine. He does have eczema and does use cortisone cream. Social and family history were reviewed. His marriage is going well as is his work. Immunizations are up to date. He has had a colonoscopy.   Review of Systems     Objective:   Physical Exam alert and in no distress. Tympanic membranes and canals are normal. Throat is clear. Tonsils are normal. Neck is supple without adenopathy or thyromegaly. Cardiac exam shows a regular sinus rhythm without murmurs or gallops. Lungs are clear to auscultation.        Assessment & Plan:  BPH (benign prostatic hyperplasia)  Essential hypertension, benign - Plan: bisoprolol-hydrochlorothiazide (ZIAC) 5-6.25 MG per tablet, CBC with Differential, Comprehensive metabolic panel  Migraine headache - Plan: SUMAtriptan (IMITREX) 100 MG tablet  Eczema - Plan: triamcinolone cream (KENALOG) 0.1 %  Encounter for long-term (current) use of other medications - Plan: CBC with Differential, Comprehensive metabolic panel, Lipid panel

## 2014-03-11 ENCOUNTER — Other Ambulatory Visit: Payer: Self-pay | Admitting: Medical

## 2014-03-11 ENCOUNTER — Other Ambulatory Visit: Payer: Self-pay | Admitting: Family Medicine

## 2014-03-13 NOTE — Telephone Encounter (Signed)
Is this ok to refill?  

## 2014-03-18 ENCOUNTER — Other Ambulatory Visit: Payer: Self-pay | Admitting: Family Medicine

## 2014-03-20 NOTE — Telephone Encounter (Signed)
Is this ok to refill?  

## 2014-06-19 ENCOUNTER — Other Ambulatory Visit: Payer: Self-pay | Admitting: Family Medicine

## 2014-06-19 NOTE — Telephone Encounter (Signed)
IS THIS OKAY 

## 2015-01-23 ENCOUNTER — Telehealth: Payer: Self-pay | Admitting: Family Medicine

## 2015-01-23 NOTE — Telephone Encounter (Signed)
Recv'd fax from [facility] stating high use of Imitrex.  Per [employer], pt needs appt.  Left message for pt to schedule an appointment.

## 2015-02-13 ENCOUNTER — Ambulatory Visit (INDEPENDENT_AMBULATORY_CARE_PROVIDER_SITE_OTHER): Payer: BC Managed Care – PPO | Admitting: Family Medicine

## 2015-02-13 ENCOUNTER — Encounter: Payer: Self-pay | Admitting: Family Medicine

## 2015-02-13 VITALS — BP 128/90 | HR 60 | Ht 70.5 in | Wt 149.0 lb

## 2015-02-13 DIAGNOSIS — Z0000 Encounter for general adult medical examination without abnormal findings: Secondary | ICD-10-CM

## 2015-02-13 DIAGNOSIS — G43109 Migraine with aura, not intractable, without status migrainosus: Secondary | ICD-10-CM

## 2015-02-13 DIAGNOSIS — N400 Enlarged prostate without lower urinary tract symptoms: Secondary | ICD-10-CM | POA: Diagnosis not present

## 2015-02-13 DIAGNOSIS — I10 Essential (primary) hypertension: Secondary | ICD-10-CM | POA: Diagnosis not present

## 2015-02-13 LAB — POCT URINALYSIS DIPSTICK
BILIRUBIN UA: NEGATIVE
Blood, UA: NEGATIVE
Glucose, UA: NEGATIVE
KETONES UA: NEGATIVE
Leukocytes, UA: NEGATIVE
Nitrite, UA: NEGATIVE
PH UA: 6
Protein, UA: NEGATIVE
Spec Grav, UA: 1.025
Urobilinogen, UA: NEGATIVE

## 2015-02-13 MED ORDER — BISOPROLOL-HYDROCHLOROTHIAZIDE 5-6.25 MG PO TABS: 1.0000 | ORAL_TABLET | Freq: Every day | ORAL | Status: DC

## 2015-02-13 NOTE — Progress Notes (Signed)
Subjective:    Patient ID: Tony Hester, male    DOB: 12/12/1960, 54 y.o.   MRN: [id number]  HPI He is here for an annual exam. Reports migraines have been more frequent in past 6 weeks. He is having 2 per week and they are always during the week, not on the weekend. He states the headaches occur at different times of the day and are typically right frontal and associated with photophobia and occasional nausea. He is able to continue working or doing what he needs to do with the headaches and gets relief after taking 1 Imitrex. Reports looking at a computer a lot for work. Denies headache presently, also denies recent fever, chills, weight loss, fatigue, chest pain, palpitations, cough, DOE, or GI/GU symptoms.   He sees [facility] annually for BPH. Miralax keeps his bowels "regular". Colonoscopy up to date.  He is active and likes to hike with his wife. Diet is well balanced. He does not smoke or drink alcohol.  Family and social history reviewed. Health maintenance and immunizations also reviewed.  Review of Systems  All other systems reviewed and are negative.      Objective:   Physical Exam BP 128/90 mmHg  Pulse 60  Ht 5' 10.5" (1.791 m)  Wt 149 lb (67.586 kg)  BMI 21.07 kg/m2  SpO2 99%  General Appearance:    Alert, cooperative, no distress, appears stated age  Head:    Normocephalic, without obvious abnormality, atraumatic  Eyes:    PERRL, conjunctiva/corneas clear, EOM's intact, fundi    benign, both eyes       Ears:    Normal TM's. Right external ear canal has a fair amount of cerumen. Left ear canal normal.  Nose:   Nares normal, septum midline, mucosa normal, no drainage   or sinus tenderness  Throat:   Lips, mucosa, and tongue normal; teeth and gums normal  Neck:   Supple, symmetrical, trachea midline, no adenopathy;       thyroid:  No enlargement/tenderness/nodules.  Back:     Symmetric, no curvature, ROM normal, no CVA tenderness  Lungs:     Clear to auscultation  bilaterally, respirations unlabored  Chest wall:    No tenderness or deformity  Heart:    Regular rate and rhythm, S1 and S2 normal, no murmur, rub   or gallop  Abdomen:     Soft, non-tender, bowel sounds active all four quadrants,    no masses, no organomegaly  Genitalia:   deferred  Rectal:   deferred  Extremities:   Extremities normal, atraumatic, no cyanosis or edema  Pulses:   2+ and symmetric all extremities  Skin:   Skin color, texture, turgor normal, no rashes or lesions  Lymph nodes:   Cervical, supraclavicular, and axillary nodes normal  Neurologic:   CNII-XII intact. Normal strength, sensation and reflexes      throughout        Assessment & Plan:  Migraine with aura and without status migrainosus, not intractable  Essential hypertension, benign - Plan: bisoprolol-hydrochlorothiazide (ZIAC) 5-6.25 MG per tablet  BPH (benign prostatic hyperplasia)  Routine general medical examination at a health care facility - Plan: POCT Urinalysis Dipstick  Discussed study for migraines. He will wait for them to call and determine if he is interested in participating. If he decides not to participate, he will let me know and we will consider starting preventive migraine medication. Encouraged him to continue taking good care of himself by eating a healthy diet and  staying active. Discussed drawing labs today but he would prefer to avoid this unless necessary. I think it is reasonable to wait since his last labs were normal and he is not on a lot of medications.

## 2015-07-24 ENCOUNTER — Other Ambulatory Visit: Payer: Self-pay | Admitting: Family Medicine

## 2015-07-24 NOTE — Telephone Encounter (Signed)
IS THIS OKAY 

## 2016-02-20 ENCOUNTER — Other Ambulatory Visit: Payer: Self-pay | Admitting: Family Medicine

## 2016-03-01 ENCOUNTER — Other Ambulatory Visit: Payer: Self-pay | Admitting: Family Medicine

## 2016-03-03 NOTE — Telephone Encounter (Signed)
Is this okay to refill? 

## 2016-05-13 ENCOUNTER — Encounter: Payer: Self-pay | Admitting: Family Medicine

## 2016-05-13 ENCOUNTER — Ambulatory Visit (INDEPENDENT_AMBULATORY_CARE_PROVIDER_SITE_OTHER): Payer: BC Managed Care – PPO | Admitting: Family Medicine

## 2016-05-13 VITALS — BP 140/82 | HR 58 | Ht 70.0 in | Wt 152.8 lb

## 2016-05-13 DIAGNOSIS — I10 Essential (primary) hypertension: Secondary | ICD-10-CM | POA: Diagnosis not present

## 2016-05-13 DIAGNOSIS — G43009 Migraine without aura, not intractable, without status migrainosus: Secondary | ICD-10-CM

## 2016-05-13 DIAGNOSIS — Z0000 Encounter for general adult medical examination without abnormal findings: Secondary | ICD-10-CM

## 2016-05-13 DIAGNOSIS — N400 Enlarged prostate without lower urinary tract symptoms: Secondary | ICD-10-CM

## 2016-05-13 DIAGNOSIS — Z1159 Encounter for screening for other viral diseases: Secondary | ICD-10-CM | POA: Diagnosis not present

## 2016-05-13 LAB — CBC WITH DIFFERENTIAL/PLATELET
BASOS ABS: 0 {cells}/uL (ref 0–200)
Basophils Relative: 0 %
EOS ABS: 82 {cells}/uL (ref 15–500)
Eosinophils Relative: 2 %
HCT: 43.2 % (ref 38.5–50.0)
Hemoglobin: 14.8 g/dL (ref 13.2–17.1)
Lymphocytes Relative: 26 %
Lymphs Abs: 1066 cells/uL (ref 850–3900)
MCH: 31.8 pg (ref 27.0–33.0)
MCHC: 34.3 g/dL (ref 32.0–36.0)
MCV: 92.7 fL (ref 80.0–100.0)
MONOS PCT: 6 %
MPV: 10 fL (ref 7.5–12.5)
Monocytes Absolute: 246 cells/uL (ref 200–950)
NEUTROS ABS: 2706 {cells}/uL (ref 1500–7800)
Neutrophils Relative %: 66 %
PLATELETS: 154 10*3/uL (ref 140–400)
RBC: 4.66 MIL/uL (ref 4.20–5.80)
RDW: 13.4 % (ref 11.0–15.0)
WBC: 4.1 10*3/uL (ref 4.0–10.5)

## 2016-05-13 LAB — COMPREHENSIVE METABOLIC PANEL
ALBUMIN: 4.6 g/dL (ref 3.6–5.1)
ALT: 22 U/L (ref 9–46)
AST: 21 U/L (ref 10–35)
Alkaline Phosphatase: 49 U/L (ref 40–115)
BUN: 14 mg/dL (ref 7–25)
CHLORIDE: 106 mmol/L (ref 98–110)
CO2: 27 mmol/L (ref 20–31)
Calcium: 9.2 mg/dL (ref 8.6–10.3)
Creat: 0.99 mg/dL (ref 0.70–1.33)
Glucose, Bld: 89 mg/dL (ref 65–99)
POTASSIUM: 3.9 mmol/L (ref 3.5–5.3)
Sodium: 138 mmol/L (ref 135–146)
TOTAL PROTEIN: 6.5 g/dL (ref 6.1–8.1)
Total Bilirubin: 0.9 mg/dL (ref 0.2–1.2)

## 2016-05-13 LAB — POCT URINALYSIS DIPSTICK
BILIRUBIN UA: NEGATIVE
Glucose, UA: NEGATIVE
KETONES UA: NEGATIVE
Leukocytes, UA: NEGATIVE
Nitrite, UA: NEGATIVE
PH UA: 7.5
Protein, UA: NEGATIVE
RBC UA: NEGATIVE
SPEC GRAV UA: 1.02
Urobilinogen, UA: NEGATIVE

## 2016-05-13 LAB — LIPID PANEL
CHOL/HDL RATIO: 2.1 ratio (ref <=5.0)
CHOLESTEROL: 164 mg/dL (ref 125–200)
HDL: 80 mg/dL (ref >=40)
LDL Cholesterol: 74 mg/dL (ref <130)
TRIGLYCERIDES: 51 mg/dL (ref <150)
VLDL: 10 mg/dL (ref <30)

## 2016-05-13 MED ORDER — TAMSULOSIN HCL 0.4 MG PO CAPS: 0.4000 mg | ORAL_CAPSULE | Freq: Every day | ORAL | Status: DC

## 2016-05-13 MED ORDER — BISOPROLOL-HYDROCHLOROTHIAZIDE 5-6.25 MG PO TABS: 1.0000 | ORAL_TABLET | Freq: Every day | ORAL | Status: DC

## 2016-05-13 MED ORDER — SUMATRIPTAN SUCCINATE 100 MG PO TABS: ORAL_TABLET | ORAL | Status: DC

## 2016-05-13 MED ORDER — FINASTERIDE 5 MG PO TABS: 5.0000 mg | ORAL_TABLET | Freq: Every day | ORAL | Status: DC

## 2016-05-13 NOTE — Progress Notes (Signed)
Subjective:    Patient ID: Tony Hester, male    DOB: 07-23-1961, 55 y.o.   MRN: [id number]  HPI He is here for complete examination. He does have a history of migraine headaches and gets roughly 6 per month but this is intermittent in nature. The headache usually is associated with nausea which triggers him to use Imitrex which does work. He has no aura with this. He also has underlying BPH and is being followed by a urologist. He is on medications for this and this seems to be working well. Continues on his blood pressure medication without difficulty. He has no other concerns or complaints. Specifically no chest pain, shortness breath, GI issues. His marriage is going well. Work is stressful. He has 2 more years before he can retire. Family and social history as well as health maintenance and immunizations was reviewed. He does walk for exercise.   Review of Systems  All other systems reviewed and are negative.      Objective:   Physical Exam BP 140/82 mmHg  Pulse 58  Ht 5\' 10"  (1.778 m)  Wt 152 lb 12.8 oz (69.31 kg)  BMI 21.92 kg/m2  General Appearance:    Alert, cooperative, no distress, appears stated age  Head:    Normocephalic, without obvious abnormality, atraumatic  Eyes:    PERRL, conjunctiva/corneas clear, EOM's intact, fundi    benign  Ears:    Normal TM's and external ear canals  Nose:   Nares normal, mucosa normal, no drainage or sinus   tenderness  Throat:   Lips, mucosa, and tongue normal; teeth and gums normal  Neck:   Supple, no lymphadenopathy;  thyroid:  no   enlargement/tenderness/nodules; no carotid   bruit or JVD  Back:    Spine nontender, no curvature, ROM normal, no CVA     tenderness  Lungs:     Clear to auscultation bilaterally without wheezes, rales or     ronchi; respirations unlabored  Chest Wall:    No tenderness or deformity   Heart:    Regular rate and rhythm, S1 and S2 normal, no murmur, rub   or gallop  Breast Exam:    No chest wall tenderness,  masses or gynecomastia  Abdomen:     Soft, non-tender, nondistended, normoactive bowel sounds,    no masses, no hepatosplenomegaly  Genitalia:    Normal male external genitalia without lesions.  Testicles without masses.  No inguinal hernias.     Extremities:   No clubbing, cyanosis or edema  Pulses:   2+ and symmetric all extremities  Skin:   Skin color, texture, turgor normal, no rashes or lesions  Lymph nodes:   Cervical, supraclavicular, and axillary nodes normal  Neurologic:   CNII-XII intact, normal strength, sensation and gait; reflexes 2+ and symmetric throughout          Psych:   Normal mood, affect, hygiene and grooming.         Assessment & Plan:  Routine general medical examination at a health care facility - Plan: POCT Urinalysis Dipstick, CBC with Differential/Platelet, Comprehensive metabolic panel, Lipid panel, PSA  Migraine without aura and without status migrainosus, not intractable - Plan: SUMAtriptan (IMITREX) 100 MG tablet  Essential hypertension, benign - Plan: bisoprolol-hydrochlorothiazide (ZIAC) 5-6.25 MG tablet  BPH (benign prostatic hyperplasia) - Plan: finasteride (PROSCAR) 5 MG tablet, tamsulosin (FLOMAX) 0.4 MG CAPS capsule, PSA  Need for hepatitis C screening test - Plan: Hepatitis C antibody Discussed possible preventative medication for his  migraines and he is comfortable with his present medication regimen. He will continue on his present medications. I did renew his urologic meds. He will follow-up with the urologist in the next several months.

## 2016-05-14 LAB — HEPATITIS C ANTIBODY: HCV Ab: NEGATIVE

## 2016-05-14 LAB — PSA: PSA: 0.07 ng/mL (ref <=4.00)

## 2016-05-20 ENCOUNTER — Other Ambulatory Visit: Payer: Self-pay | Admitting: Family Medicine

## 2016-05-20 ENCOUNTER — Other Ambulatory Visit: Payer: Self-pay

## 2016-05-20 NOTE — Telephone Encounter (Signed)
Called in.

## 2016-05-20 NOTE — Telephone Encounter (Signed)
Is this okay to refill? 

## 2016-08-27 ENCOUNTER — Ambulatory Visit (INDEPENDENT_AMBULATORY_CARE_PROVIDER_SITE_OTHER): Payer: BC Managed Care – PPO | Admitting: Family Medicine

## 2016-08-27 ENCOUNTER — Encounter: Payer: Self-pay | Admitting: Family Medicine

## 2016-08-27 VITALS — BP 150/90 | HR 67 | Wt 159.0 lb

## 2016-08-27 DIAGNOSIS — M659 Synovitis and tenosynovitis, unspecified: Secondary | ICD-10-CM

## 2016-08-27 NOTE — Progress Notes (Signed)
   Subjective:    Patient ID: Tony Hester, male    DOB: 07-01-1961, 55 y.o.   MRN: [id number]  HPI He complains of a 6 day history of aching of the left foot. There is also been some slight swelling and stiffness. No history of injury, overuse, other joints being involved, fever, chills or skin changes.   Review of Systems     Objective:   Physical Exam [place] and in no distress. Exam of the left foot does show some slight swelling but is not warm tender or red in the area of the talus. Full motion of the ankle.       Assessment & Plan:  Synovitis of left foot Explained that this most likely is synovitis and recommended supportive care with an inside. If continued difficulty further evaluation will be needed. Trouble with this.

## 2016-08-27 NOTE — Patient Instructions (Signed)
Take 2 Aleve twice per day for the next 10-14 days

## 2016-11-29 ENCOUNTER — Other Ambulatory Visit: Payer: Self-pay | Admitting: Family Medicine

## 2016-11-29 DIAGNOSIS — G43009 Migraine without aura, not intractable, without status migrainosus: Secondary | ICD-10-CM

## 2017-05-07 ENCOUNTER — Other Ambulatory Visit: Payer: Self-pay | Admitting: Family Medicine

## 2017-05-07 DIAGNOSIS — G43009 Migraine without aura, not intractable, without status migrainosus: Secondary | ICD-10-CM

## 2017-05-07 NOTE — Telephone Encounter (Signed)
Is this okay to refill? 

## 2017-05-13 ENCOUNTER — Other Ambulatory Visit: Payer: Self-pay | Admitting: Family Medicine

## 2017-05-13 DIAGNOSIS — I10 Essential (primary) hypertension: Secondary | ICD-10-CM

## 2017-05-13 NOTE — Telephone Encounter (Signed)
Called and l/m for pt call back to make an appt for refill

## 2017-05-14 NOTE — Telephone Encounter (Signed)
Left another detailed message that Pt needs an appt before we can give a 30 day refill on. Denying med for now until appt is scheduled

## 2017-05-18 ENCOUNTER — Telehealth: Payer: Self-pay | Admitting: Family Medicine

## 2017-05-18 DIAGNOSIS — N400 Enlarged prostate without lower urinary tract symptoms: Secondary | ICD-10-CM

## 2017-05-18 DIAGNOSIS — I10 Essential (primary) hypertension: Secondary | ICD-10-CM

## 2017-05-18 MED ORDER — TAMSULOSIN HCL 0.4 MG PO CAPS: 0.4000 mg | ORAL_CAPSULE | Freq: Every day | ORAL | 0 refills | Status: DC

## 2017-05-18 MED ORDER — FINASTERIDE 5 MG PO TABS: 5.0000 mg | ORAL_TABLET | Freq: Every day | ORAL | 0 refills | Status: DC

## 2017-05-18 MED ORDER — BISOPROLOL-HYDROCHLOROTHIAZIDE 5-6.25 MG PO TABS: 1.0000 | ORAL_TABLET | Freq: Every day | ORAL | 0 refills | Status: DC

## 2017-05-18 NOTE — Telephone Encounter (Signed)
Did 30 days on his 3 maintenance meds.

## 2017-05-18 NOTE — Telephone Encounter (Signed)
Pt called and made an appt for next week. Please refill meds.

## 2017-05-29 ENCOUNTER — Encounter: Payer: Self-pay | Admitting: Family Medicine

## 2017-05-29 ENCOUNTER — Ambulatory Visit (INDEPENDENT_AMBULATORY_CARE_PROVIDER_SITE_OTHER): Payer: BC Managed Care – PPO | Admitting: Family Medicine

## 2017-05-29 VITALS — BP 118/80 | HR 57 | Wt 161.4 lb

## 2017-05-29 DIAGNOSIS — N400 Enlarged prostate without lower urinary tract symptoms: Secondary | ICD-10-CM | POA: Diagnosis not present

## 2017-05-29 DIAGNOSIS — I10 Essential (primary) hypertension: Secondary | ICD-10-CM

## 2017-05-29 DIAGNOSIS — G43009 Migraine without aura, not intractable, without status migrainosus: Secondary | ICD-10-CM

## 2017-05-29 LAB — CBC WITH DIFFERENTIAL/PLATELET
BASOS ABS: 44 {cells}/uL (ref 0–200)
Basophils Relative: 1 %
EOS ABS: 176 {cells}/uL (ref 15–500)
Eosinophils Relative: 4 %
HCT: 42 % (ref 38.5–50.0)
Hemoglobin: 14.4 g/dL (ref 13.2–17.1)
Lymphocytes Relative: 22 %
Lymphs Abs: 968 cells/uL (ref 850–3900)
MCH: 31.9 pg (ref 27.0–33.0)
MCHC: 34.3 g/dL (ref 32.0–36.0)
MCV: 92.9 fL (ref 80.0–100.0)
MONOS PCT: 9 %
MPV: 9.7 fL (ref 7.5–12.5)
Monocytes Absolute: 396 cells/uL (ref 200–950)
NEUTROS ABS: 2816 {cells}/uL (ref 1500–7800)
Neutrophils Relative %: 64 %
PLATELETS: 150 10*3/uL (ref 140–400)
RBC: 4.52 MIL/uL (ref 4.20–5.80)
RDW: 12.6 % (ref 11.0–15.0)
WBC: 4.4 10*3/uL (ref 4.0–10.5)

## 2017-05-29 LAB — COMPREHENSIVE METABOLIC PANEL
ALBUMIN: 4.3 g/dL (ref 3.6–5.1)
ALT: 12 U/L (ref 9–46)
AST: 16 U/L (ref 10–35)
Alkaline Phosphatase: 54 U/L (ref 40–115)
BUN: 19 mg/dL (ref 7–25)
CHLORIDE: 105 mmol/L (ref 98–110)
CO2: 26 mmol/L (ref 20–31)
Calcium: 9.1 mg/dL (ref 8.6–10.3)
Creat: 0.94 mg/dL (ref 0.70–1.33)
Glucose, Bld: 88 mg/dL (ref 65–99)
POTASSIUM: 4.1 mmol/L (ref 3.5–5.3)
Sodium: 139 mmol/L (ref 135–146)
TOTAL PROTEIN: 6.4 g/dL (ref 6.1–8.1)
Total Bilirubin: 0.9 mg/dL (ref 0.2–1.2)

## 2017-05-29 MED ORDER — SUMATRIPTAN SUCCINATE 100 MG PO TABS: ORAL_TABLET | ORAL | 3 refills | Status: DC

## 2017-05-29 MED ORDER — BISOPROLOL-HYDROCHLOROTHIAZIDE 5-6.25 MG PO TABS: 1.0000 | ORAL_TABLET | Freq: Every day | ORAL | 3 refills | Status: DC

## 2017-05-29 MED ORDER — FINASTERIDE 5 MG PO TABS: 5.0000 mg | ORAL_TABLET | Freq: Every day | ORAL | 3 refills | Status: DC

## 2017-05-29 MED ORDER — TAMSULOSIN HCL 0.4 MG PO CAPS: 0.4000 mg | ORAL_CAPSULE | Freq: Every day | ORAL | 3 refills | Status: DC

## 2017-05-29 NOTE — Progress Notes (Signed)
   Subjective:    Patient ID: Tony Hester, male    DOB: [birth year], 56 y.o.   MRN: [id number]  HPI He is here for an interval evaluation. He does have hypertension and is doing well on his present blood pressure medication. His migraines seem to be under good control. He does have BPH and presently is on finasteride as well as Flomax and having no difficulty with that. His work and home life are going well. He has no other concerns or complaints.   Review of Systems     Objective:   Physical Exam Alert and in no distress. Tympanic membranes and canals are normal. Pharyngeal area is normal. Neck is supple without adenopathy or thyromegaly. Cardiac exam shows a regular sinus rhythm without murmurs or gallops. Lungs are clear to auscultation. Abdominal exam shows no masses or tenderness.       Assessment & Plan:  Essential hypertension, benign - Plan: CBC with Differential/Platelet, Comprehensive metabolic panel, bisoprolol-hydrochlorothiazide (ZIAC) 5-6.25 MG tablet  Benign prostatic hyperplasia, unspecified whether lower urinary tract symptoms present - Plan: tamsulosin (FLOMAX) 0.4 MG CAPS capsule, finasteride (PROSCAR) 5 MG tablet  Migraine without aura and without status migrainosus, not intractable - Plan: SUMAtriptan (IMITREX) 100 MG tablet

## 2017-08-04 ENCOUNTER — Ambulatory Visit (INDEPENDENT_AMBULATORY_CARE_PROVIDER_SITE_OTHER): Payer: BC Managed Care – PPO | Admitting: Family Medicine

## 2017-08-04 VITALS — BP 120/82 | HR 58 | Temp 97.9°F | Wt 162.0 lb

## 2017-08-04 DIAGNOSIS — N400 Enlarged prostate without lower urinary tract symptoms: Secondary | ICD-10-CM

## 2017-08-04 DIAGNOSIS — R300 Dysuria: Secondary | ICD-10-CM | POA: Diagnosis not present

## 2017-08-04 DIAGNOSIS — N410 Acute prostatitis: Secondary | ICD-10-CM | POA: Diagnosis not present

## 2017-08-04 LAB — POCT URINALYSIS DIP (PROADVANTAGE DEVICE)
Bilirubin, UA: NEGATIVE
Blood, UA: NEGATIVE
GLUCOSE UA: NEGATIVE mg/dL
Ketones, POC UA: NEGATIVE mg/dL
LEUKOCYTES UA: NEGATIVE
NITRITE UA: NEGATIVE
PROTEIN UA: NEGATIVE mg/dL
SPECIFIC GRAVITY, URINE: 1.025
UUROB: NEGATIVE
pH, UA: 6 (ref 5.0–8.0)

## 2017-08-04 LAB — HEMOCCULT GUIAC POC 1CARD (OFFICE)

## 2017-08-04 MED ORDER — SULFAMETHOXAZOLE-TRIMETHOPRIM 800-160 MG PO TABS: 1.0000 | ORAL_TABLET | Freq: Two times a day (BID) | ORAL | 0 refills | Status: DC

## 2017-08-04 NOTE — Progress Notes (Signed)
   Subjective:    Patient ID: Tony Hester, male    DOB: 1960-12-08, 56 y.o.   MRN: [id number]  HPI He is here for evaluation of on 2 occasions seeing pinkish urine and some slight dysuria especially at the end of urination with no discharge. No recent sexual activity. He does have a history of BPH and presently is on finasteride as well as Flomax. This apparently is working well for him. No fever, chills, abdominal or back pain   Review of Systems     Objective:   Physical Exam Alert and in no distress. Abdominal exam shows no masses or tenderness. Genitalia normal. Rectal exam is guaiac negative; prostate is boggy and nontender       Assessment & Plan:  Burning with urination - Plan: POCT Urinalysis DIP (Proadvantage Device), sulfamethoxazole-trimethoprim (BACTRIM DS,SEPTRA DS) 800-160 MG tablet  Acute prostatitis - Plan: sulfamethoxazole-trimethoprim (BACTRIM DS,SEPTRA DS) 800-160 MG tablet  Benign prostatic hyperplasia, unspecified whether lower urinary tract symptoms present . Since his urine was negative for blood, he will follow-up with urology as previously scheduled.

## 2017-08-26 ENCOUNTER — Telehealth: Payer: Self-pay | Admitting: Family Medicine

## 2017-08-26 NOTE — Telephone Encounter (Signed)
Called and left message for pt to call back and schedule

## 2017-08-26 NOTE — Telephone Encounter (Signed)
Pt's wife called and stated that pt would like to receive a shingrix vaccine. She has contacted his insurance company and they will pay. Please advise wife, Becky Sax at [phone].

## 2017-08-26 NOTE — Telephone Encounter (Signed)
Set this up 

## 2017-10-28 ENCOUNTER — Other Ambulatory Visit (INDEPENDENT_AMBULATORY_CARE_PROVIDER_SITE_OTHER): Payer: BC Managed Care – PPO

## 2017-10-28 DIAGNOSIS — Z23 Encounter for immunization: Secondary | ICD-10-CM

## 2017-12-17 ENCOUNTER — Other Ambulatory Visit: Payer: Self-pay | Admitting: Family Medicine

## 2017-12-17 DIAGNOSIS — G43009 Migraine without aura, not intractable, without status migrainosus: Secondary | ICD-10-CM

## 2017-12-17 NOTE — Telephone Encounter (Signed)
Is this okay to refill? 

## 2017-12-30 ENCOUNTER — Other Ambulatory Visit (INDEPENDENT_AMBULATORY_CARE_PROVIDER_SITE_OTHER): Payer: BC Managed Care – PPO

## 2017-12-30 DIAGNOSIS — Z23 Encounter for immunization: Secondary | ICD-10-CM

## 2018-01-06 ENCOUNTER — Telehealth: Payer: Self-pay | Admitting: Family Medicine

## 2018-01-06 MED ORDER — PROMETHAZINE HCL 25 MG RE SUPP: 25.0000 mg | Freq: Four times a day (QID) | RECTAL | 0 refills | Status: DC | PRN

## 2018-01-06 NOTE — Telephone Encounter (Signed)
Wife called and states pt would like refill on phenergan (Wants the suppositories), for nausea and vomiting for his migraines to [facility]

## 2018-02-04 ENCOUNTER — Other Ambulatory Visit: Payer: Self-pay | Admitting: Family Medicine

## 2018-02-04 DIAGNOSIS — G43009 Migraine without aura, not intractable, without status migrainosus: Secondary | ICD-10-CM

## 2018-02-05 NOTE — Telephone Encounter (Signed)
please advise if imtrex can be filled at [facility]. Thanks [employer] [employer]

## 2018-06-03 ENCOUNTER — Other Ambulatory Visit: Payer: Self-pay | Admitting: Family Medicine

## 2018-06-03 NOTE — Telephone Encounter (Signed)
Ok to give him one refill. Then he may need to see Dr. Redmond School.

## 2018-06-03 NOTE — Telephone Encounter (Signed)
[facility] is requesting to fill pt imitrex. Please advise [facility] 

## 2018-06-09 ENCOUNTER — Other Ambulatory Visit: Payer: Self-pay | Admitting: Family Medicine

## 2018-06-09 DIAGNOSIS — I10 Essential (primary) hypertension: Secondary | ICD-10-CM

## 2018-07-15 ENCOUNTER — Other Ambulatory Visit: Payer: Self-pay | Admitting: Family Medicine

## 2018-07-15 NOTE — Telephone Encounter (Signed)
Is this okay to refill. Pt hasn't been in since 2018

## 2018-07-26 ENCOUNTER — Other Ambulatory Visit: Payer: Self-pay | Admitting: Family Medicine

## 2018-07-26 DIAGNOSIS — N400 Enlarged prostate without lower urinary tract symptoms: Secondary | ICD-10-CM

## 2018-08-07 ENCOUNTER — Other Ambulatory Visit: Payer: Self-pay | Admitting: Family Medicine

## 2018-08-09 MED ORDER — SUMATRIPTAN SUCCINATE 100 MG PO TABS: ORAL_TABLET | ORAL | 1 refills | Status: DC

## 2018-08-09 NOTE — Telephone Encounter (Signed)
yours

## 2018-08-09 NOTE — Addendum Note (Signed)
Addended by: Denita Lung on: 08/09/2018 02:52 PM   Modules accepted: Orders

## 2018-08-09 NOTE — Telephone Encounter (Signed)
Is this ok to refill?  

## 2018-08-26 ENCOUNTER — Other Ambulatory Visit: Payer: Self-pay | Admitting: Family Medicine

## 2018-08-26 DIAGNOSIS — N400 Enlarged prostate without lower urinary tract symptoms: Secondary | ICD-10-CM

## 2018-09-06 ENCOUNTER — Other Ambulatory Visit: Payer: Self-pay | Admitting: Family Medicine

## 2018-09-06 DIAGNOSIS — I10 Essential (primary) hypertension: Secondary | ICD-10-CM

## 2018-09-28 ENCOUNTER — Other Ambulatory Visit: Payer: Self-pay | Admitting: Medical

## 2018-09-28 ENCOUNTER — Other Ambulatory Visit: Payer: Self-pay | Admitting: Family Medicine

## 2018-09-28 DIAGNOSIS — N400 Enlarged prostate without lower urinary tract symptoms: Secondary | ICD-10-CM

## 2018-09-29 NOTE — Telephone Encounter (Signed)
Is this ok to refill?  

## 2018-11-04 ENCOUNTER — Ambulatory Visit: Payer: BC Managed Care – PPO | Admitting: Family Medicine

## 2018-11-04 ENCOUNTER — Encounter: Payer: Self-pay | Admitting: Family Medicine

## 2018-11-04 VITALS — BP 160/92 | HR 68 | Temp 98.1°F | Ht 69.0 in | Wt 161.4 lb

## 2018-11-04 DIAGNOSIS — Z23 Encounter for immunization: Secondary | ICD-10-CM | POA: Diagnosis not present

## 2018-11-04 DIAGNOSIS — Z0000 Encounter for general adult medical examination without abnormal findings: Secondary | ICD-10-CM

## 2018-11-04 DIAGNOSIS — G43109 Migraine with aura, not intractable, without status migrainosus: Secondary | ICD-10-CM | POA: Diagnosis not present

## 2018-11-04 DIAGNOSIS — N400 Enlarged prostate without lower urinary tract symptoms: Secondary | ICD-10-CM

## 2018-11-04 DIAGNOSIS — I10 Essential (primary) hypertension: Secondary | ICD-10-CM

## 2018-11-04 LAB — POCT URINALYSIS DIP (PROADVANTAGE DEVICE)
Bilirubin, UA: NEGATIVE
Blood, UA: NEGATIVE
Glucose, UA: NEGATIVE mg/dL
Ketones, POC UA: NEGATIVE mg/dL
Leukocytes, UA: NEGATIVE
Nitrite, UA: NEGATIVE
PROTEIN UA: NEGATIVE mg/dL
Specific Gravity, Urine: 1.015
Urobilinogen, Ur: 3.5
pH, UA: 7.5 (ref 5.0–8.0)

## 2018-11-04 LAB — COMPREHENSIVE METABOLIC PANEL
ALT: 22 IU/L (ref 0–44)
AST: 24 IU/L (ref 0–40)
Albumin/Globulin Ratio: 2.4 — ABNORMAL HIGH (ref 1.2–2.2)
Albumin: 5 g/dL (ref 3.5–5.5)
Alkaline Phosphatase: 75 IU/L (ref 39–117)
BUN/Creatinine Ratio: 13 (ref 9–20)
BUN: 14 mg/dL (ref 6–24)
Bilirubin Total: 0.7 mg/dL (ref 0.0–1.2)
CO2: 26 mmol/L (ref 20–29)
Calcium: 9.6 mg/dL (ref 8.7–10.2)
Chloride: 98 mmol/L (ref 96–106)
Creatinine, Ser: 1.08 mg/dL (ref 0.76–1.27)
GFR calc Af Amer: 87 mL/min/{1.73_m2} (ref >59)
GFR calc non Af Amer: 75 mL/min/{1.73_m2} (ref >59)
Globulin, Total: 2.1 g/dL (ref 1.5–4.5)
Glucose: 85 mg/dL (ref 65–99)
Potassium: 4.1 mmol/L (ref 3.5–5.2)
Sodium: 140 mmol/L (ref 134–144)
Total Protein: 7.1 g/dL (ref 6.0–8.5)

## 2018-11-04 LAB — CBC WITH DIFFERENTIAL/PLATELET
Basophils Absolute: 0 10*3/uL (ref 0.0–0.2)
Basos: 1 %
EOS (ABSOLUTE): 0.3 10*3/uL (ref 0.0–0.4)
Eos: 7 %
Hematocrit: 43.4 % (ref 37.5–51.0)
Hemoglobin: 15.1 g/dL (ref 13.0–17.7)
Immature Grans (Abs): 0 10*3/uL (ref 0.0–0.1)
Immature Granulocytes: 0 %
Lymphocytes Absolute: 0.9 10*3/uL (ref 0.7–3.1)
Lymphs: 22 %
MCH: 31.9 pg (ref 26.6–33.0)
MCHC: 34.8 g/dL (ref 31.5–35.7)
MCV: 92 fL (ref 79–97)
Monocytes Absolute: 0.8 10*3/uL (ref 0.1–0.9)
Monocytes: 18 %
Neutrophils Absolute: 2.2 10*3/uL (ref 1.4–7.0)
Neutrophils: 52 %
PLATELETS: 198 10*3/uL (ref 150–450)
RBC: 4.74 x10E6/uL (ref 4.14–5.80)
RDW: 11.9 % — ABNORMAL LOW (ref 12.3–15.4)
WBC: 4.2 10*3/uL (ref 3.4–10.8)

## 2018-11-04 LAB — LIPID PANEL
Chol/HDL Ratio: 2.8 ratio (ref 0.0–5.0)
Cholesterol, Total: 162 mg/dL (ref 100–199)
HDL: 57 mg/dL (ref >39)
LDL CALC: 89 mg/dL (ref 0–99)
Triglycerides: 81 mg/dL (ref 0–149)
VLDL Cholesterol Cal: 16 mg/dL (ref 5–40)

## 2018-11-04 MED ORDER — BISOPROLOL-HYDROCHLOROTHIAZIDE 5-6.25 MG PO TABS: 1.0000 | ORAL_TABLET | Freq: Every day | ORAL | 3 refills | Status: DC

## 2018-11-04 NOTE — Progress Notes (Signed)
Established Patient Office Visit  Subjective:  Patient ID: Tony Hester, male    DOB: 1961/04/13  Age: 57 y.o. MRN: [id number]  CC:  Chief Complaint  Patient presents with  . other    Fasting Cpe/ T Dap needed.     HPI Tony Hester presents for a complete exam.  He has no particular concerns or complaints.  His urinary symptoms have improved and he continues on finasteride as well as Flomax and having no difficulty with that.  He continues on his blood pressure medication without problems.  Does note that his migraines get out of control if his sleep or eating habits do change.  His work is steady and he is considering retiring in the near future, within the next year or 2.  He and his wife are getting along fairly well.  Past Medical History:  Diagnosis Date  . Eczema   . Enlarged prostate   . Hypertension   . Migraine headache     Past Surgical History:  Procedure Laterality Date  . bilateral inguinal hernia  2006  . WISDOM TOOTH EXTRACTION  2007    Family History  Problem Relation Age of Onset  . Cancer Father   . Colon cancer Neg Hx   . Stomach cancer Neg Hx     Social History   Socioeconomic History  . Marital status: Married    Spouse name: Not on file  . Number of children: Not on file  . Years of education: Not on file  . Highest education level: Not on file  Occupational History  . Not on file  Social Needs  . Financial resource strain: Not on file  . Food insecurity:    Worry: Not on file    Inability: Not on file  . Transportation needs:    Medical: Not on file    Non-medical: Not on file  Tobacco Use  . Smoking status: Never Smoker  . Smokeless tobacco: Never Used  Substance and Sexual Activity  . Alcohol use: Yes    Comment: maybe 2 drinks per month.  . Drug use: No  . Sexual activity: Not Currently  Lifestyle  . Physical activity:    Days per week: Not on file    Minutes per session: Not on file  . Stress: Not on file  Relationships  .  Social connections:    Talks on phone: Not on file    Gets together: Not on file    Attends religious service: Not on file    Active member of club or organization: Not on file    Attends meetings of clubs or organizations: Not on file    Relationship status: Not on file  . Intimate partner violence:    Fear of current or ex partner: Not on file    Emotionally abused: Not on file    Physically abused: Not on file    Forced sexual activity: Not on file  Other Topics Concern  . Not on file  Social History Narrative  . Not on file    Outpatient Medications Prior to Visit  Medication Sig Dispense Refill  . aspirin 81 MG tablet Take 81 mg by mouth daily.      . finasteride (PROSCAR) 5 MG tablet TAKE ONE TABLET BY MOUTH DAILY 30 tablet 0  . Multiple Vitamin (MULTIVITAMIN) tablet Take 1 tablet by mouth daily.    . promethazine (PHENERGAN) 25 MG suppository Place 1 suppository (25 mg total) rectally every 6 (six) hours  as needed for nausea or vomiting. 12 each 0  . SUMAtriptan (IMITREX) 100 MG tablet TAKE ONE TABLET BY MOUTH AT ONSET OF HEADACHE; MAY REPEAT ONE TABLET IN 2 HOURS IF NEEDED. 9 tablet 1  . tamsulosin (FLOMAX) 0.4 MG CAPS capsule TAKE ONE CAPSULE BY MOUTH DAILY 30 capsule 0  . bisoprolol-hydrochlorothiazide (ZIAC) 5-6.25 MG tablet TAKE ONE TABLET BY MOUTH DAILY 90 tablet 0  . sulfamethoxazole-trimethoprim (BACTRIM DS,SEPTRA DS) 800-160 MG tablet Take 1 tablet by mouth 2 (two) times daily. (Patient not taking: Reported on 11/04/2018) 28 tablet 0  . SUMAtriptan (IMITREX) 100 MG tablet TAKE ONE TABLET BY MOUTH AT ONSET OF HEADACHE; MAY REPEAT ONE TABLET IN 2 HOURS IF NEEDED. 9 tablet 2  . triamcinolone cream (KENALOG) 0.1 % Apply 1 application topically 2 (two) times daily. (Patient not taking: Reported on 08/04/2017) 30 g 5   No facility-administered medications prior to visit.     Allergies  Allergen Reactions  . Codeine Nausea And Vomiting  . Percogesic [Diphenhydramine-Apap  (Sleep)]     palpitations    ROS Review of Systems    Objective:    Physical Exam  BP (!) 160/92 (BP Location: Left Arm, Patient Position: Sitting)   Pulse 68   Temp 98.1 F (36.7 C)   Ht 5\' 9"  (1.753 m)   Wt 161 lb 6.4 oz (73.2 kg)   SpO2 99%   BMI 23.83 kg/m  Wt Readings from Last 3 Encounters:  11/04/18 161 lb 6.4 oz (73.2 kg)  08/04/17 162 lb (73.5 kg)  05/29/17 161 lb 6.4 oz (73.2 kg)  BP (!) 160/92 (BP Location: Left Arm, Patient Position: Sitting)   Pulse 68   Temp 98.1 F (36.7 C)   Ht 5\' 9"  (1.753 m)   Wt 161 lb 6.4 oz (73.2 kg)   SpO2 99%   BMI 23.83 kg/m   General Appearance:    Alert, cooperative, no distress, appears stated age  Head:    Normocephalic, without obvious abnormality, atraumatic  Eyes:    PERRL, conjunctiva/corneas clear, EOM's intact, fundi    benign  Ears:    Normal TM's and external ear canals  Nose:   Nares normal, mucosa normal, no drainage or sinus   tenderness  Throat:   Lips, mucosa, and tongue normal; teeth and gums normal  Neck:   Supple, no lymphadenopathy;  thyroid:  no   enlargement/tenderness/nodules; no carotid   bruit or JVD     Lungs:     Clear to auscultation bilaterally without wheezes, rales or     ronchi; respirations unlabored      Heart:    Regular rate and rhythm, S1 and S2 normal, no murmur, rub   or gallop     Abdomen:     Soft, non-tender, nondistended, normoactive bowel sounds,    no masses, no hepatosplenomegaly  Genitalia:   Deferred  Rectal:   Deferred  Extremities:   No clubbing, cyanosis or edema  Pulses:   2+ and symmetric all extremities  Skin:   Skin color, texture, turgor normal, no rashes or lesions  Lymph nodes:   Cervical, supraclavicular, and axillary nodes normal  Neurologic:   CNII-XII intact, normal strength, sensation and gait; reflexes 2+ and symmetric throughout          Psych:   Normal mood, affect, hygiene and grooming.       Health Maintenance Due  Topic Date Due  . HIV  Screening  01/21/1975  . TETANUS/TDAP  05/01/2018     No results found for: TSH Lab Results  Component Value Date   WBC 4.4 05/29/2017   HGB 14.4 05/29/2017   HCT 42.0 05/29/2017   MCV 92.9 05/29/2017   PLT 150 05/29/2017   Lab Results  Component Value Date   NA 139 05/29/2017   K 4.1 05/29/2017   CO2 26 05/29/2017   GLUCOSE 88 05/29/2017   BUN 19 05/29/2017   CREATININE 0.94 05/29/2017   BILITOT 0.9 05/29/2017   ALKPHOS 54 05/29/2017   AST 16 05/29/2017   ALT 12 05/29/2017   PROT 6.4 05/29/2017   ALBUMIN 4.3 05/29/2017   CALCIUM 9.1 05/29/2017   Lab Results  Component Value Date   CHOL 164 05/13/2016   Lab Results  Component Value Date   HDL 80 05/13/2016   Lab Results  Component Value Date   LDLCALC 74 05/13/2016   Lab Results  Component Value Date   TRIG 51 05/13/2016   Lab Results  Component Value Date   CHOLHDL 2.1 05/13/2016   No results found for: HGBA1C    Assessment & Plan:   Problem List Items Addressed This Visit    BPH (benign prostatic hyperplasia)   Essential hypertension, benign   Relevant Medications   bisoprolol-hydrochlorothiazide (ZIAC) 5-6.25 MG tablet   Other Relevant Orders   CBC with Differential/Platelet   Comprehensive metabolic panel   Migraine headache   Relevant Medications   bisoprolol-hydrochlorothiazide (ZIAC) 5-6.25 MG tablet    Other Visit Diagnoses    Routine general medical examination at a health care facility    -  Primary   Relevant Orders   CBC with Differential/Platelet   Comprehensive metabolic panel   Lipid panel   POCT Urinalysis DIP (Proadvantage Device) (Completed)   Need for Tdap vaccination       Relevant Orders   Tdap vaccine greater than or equal to 7yo IM (Completed)      Meds ordered this encounter  Medications  . bisoprolol-hydrochlorothiazide (ZIAC) 5-6.25 MG tablet    Sig: Take 1 tablet by mouth daily.    Dispense:  90 tablet    Refill:  3  He will continue on his present  medication regimen.  Discussed the option of getting his finasteride and Flomax renewed by me.  He will consider that.  Return here as needed.  Follow-up: No follow-ups on file.    Jill Alexanders, MD

## 2018-12-30 ENCOUNTER — Other Ambulatory Visit: Payer: Self-pay | Admitting: Family Medicine

## 2018-12-30 DIAGNOSIS — N400 Enlarged prostate without lower urinary tract symptoms: Secondary | ICD-10-CM

## 2019-01-03 ENCOUNTER — Other Ambulatory Visit: Payer: Self-pay | Admitting: Family Medicine

## 2019-01-03 NOTE — Telephone Encounter (Signed)
[facility] is requesting to fill imitrex. Please advise [facility] 

## 2019-03-05 ENCOUNTER — Other Ambulatory Visit: Payer: Self-pay | Admitting: Family Medicine

## 2019-03-07 NOTE — Telephone Encounter (Signed)
[facility] is requesting to fill pt imitrex. Please advise [facility]

## 2019-03-28 ENCOUNTER — Other Ambulatory Visit: Payer: Self-pay | Admitting: Family Medicine

## 2019-03-28 DIAGNOSIS — N400 Enlarged prostate without lower urinary tract symptoms: Secondary | ICD-10-CM

## 2019-04-11 ENCOUNTER — Other Ambulatory Visit: Payer: Self-pay | Admitting: Family Medicine

## 2019-04-11 NOTE — Telephone Encounter (Signed)
[facility] is requesting to fill imitrex. Please advise [facility]

## 2019-06-26 ENCOUNTER — Other Ambulatory Visit: Payer: Self-pay | Admitting: Family Medicine

## 2019-06-26 DIAGNOSIS — N400 Enlarged prostate without lower urinary tract symptoms: Secondary | ICD-10-CM

## 2019-07-12 ENCOUNTER — Other Ambulatory Visit: Payer: Self-pay | Admitting: Family Medicine

## 2019-07-12 NOTE — Telephone Encounter (Signed)
[facility] is requesting to fill pt imitrex. Please office [facility]

## 2019-08-26 ENCOUNTER — Other Ambulatory Visit (INDEPENDENT_AMBULATORY_CARE_PROVIDER_SITE_OTHER): Payer: BC Managed Care – PPO

## 2019-08-26 ENCOUNTER — Other Ambulatory Visit: Payer: Self-pay

## 2019-08-26 DIAGNOSIS — Z23 Encounter for immunization: Secondary | ICD-10-CM

## 2019-09-12 ENCOUNTER — Other Ambulatory Visit: Payer: Self-pay | Admitting: Family Medicine

## 2019-09-12 ENCOUNTER — Telehealth: Payer: Self-pay | Admitting: Family Medicine

## 2019-09-12 MED ORDER — PROMETHAZINE HCL 25 MG RE SUPP: 25.0000 mg | Freq: Four times a day (QID) | RECTAL | 1 refills | Status: DC | PRN

## 2019-09-12 NOTE — Telephone Encounter (Signed)
[facility] is requesting to fill pt imitrex. Please advise [facility] 

## 2019-09-12 NOTE — Telephone Encounter (Signed)
Pt called and is requesting a refill on Phenergan pt needs it to go to the [place], [place]

## 2019-09-23 ENCOUNTER — Telehealth: Payer: Self-pay

## 2019-09-23 MED ORDER — PROMETHAZINE HCL 25 MG RE SUPP: 25.0000 mg | Freq: Four times a day (QID) | RECTAL | 1 refills | Status: DC | PRN

## 2019-09-23 NOTE — Telephone Encounter (Signed)
I called the medicine in

## 2019-09-23 NOTE — Telephone Encounter (Signed)
LVM advising pt . [facility] °

## 2019-09-23 NOTE — Telephone Encounter (Signed)
Patient wife called and stated he is having nausea with migraines and she wants to know if phenergan tablets can be sent to the pharmacy. He was given suppository but they left it closed up for 3 days and ended up throwing them away. Please advise

## 2019-09-26 ENCOUNTER — Other Ambulatory Visit: Payer: Self-pay | Admitting: Family Medicine

## 2019-09-26 DIAGNOSIS — N400 Enlarged prostate without lower urinary tract symptoms: Secondary | ICD-10-CM

## 2019-09-28 ENCOUNTER — Telehealth: Payer: Self-pay

## 2019-09-28 MED ORDER — PROMETHAZINE HCL 25 MG PO TABS: 25.0000 mg | ORAL_TABLET | Freq: Three times a day (TID) | ORAL | 0 refills | Status: DC | PRN

## 2019-09-28 NOTE — Telephone Encounter (Signed)
Received fax from [facility] for pts. Promethzine 25mg  suppository, pt. Would like tablets instead of suppository. If any questions can call pharmacy at [phone].

## 2019-09-28 NOTE — Telephone Encounter (Signed)
Please advise of completed script. [place]

## 2019-10-19 ENCOUNTER — Other Ambulatory Visit: Payer: Self-pay | Admitting: Family Medicine

## 2019-10-19 NOTE — Telephone Encounter (Signed)
[facility] is requesting to fill pt imitrex. Please advise [facility]

## 2019-11-07 ENCOUNTER — Ambulatory Visit: Payer: BC Managed Care – PPO | Admitting: Family Medicine

## 2019-11-07 ENCOUNTER — Other Ambulatory Visit: Payer: Self-pay

## 2019-11-07 ENCOUNTER — Encounter: Payer: Self-pay | Admitting: Family Medicine

## 2019-11-07 VITALS — BP 156/82 | HR 64 | Temp 98.3°F | Ht 69.5 in | Wt 164.8 lb

## 2019-11-07 DIAGNOSIS — N400 Enlarged prostate without lower urinary tract symptoms: Secondary | ICD-10-CM | POA: Diagnosis not present

## 2019-11-07 DIAGNOSIS — I10 Essential (primary) hypertension: Secondary | ICD-10-CM | POA: Diagnosis not present

## 2019-11-07 DIAGNOSIS — G43109 Migraine with aura, not intractable, without status migrainosus: Secondary | ICD-10-CM

## 2019-11-07 DIAGNOSIS — J309 Allergic rhinitis, unspecified: Secondary | ICD-10-CM | POA: Diagnosis not present

## 2019-11-07 DIAGNOSIS — I454 Nonspecific intraventricular block: Secondary | ICD-10-CM | POA: Insufficient documentation

## 2019-11-07 DIAGNOSIS — Z0000 Encounter for general adult medical examination without abnormal findings: Secondary | ICD-10-CM | POA: Diagnosis not present

## 2019-11-07 LAB — LIPID PANEL
Chol/HDL Ratio: 2.7 ratio (ref 0.0–5.0)
Cholesterol, Total: 176 mg/dL (ref 100–199)
HDL: 66 mg/dL (ref >39)
LDL Chol Calc (NIH): 99 mg/dL (ref 0–99)
Triglycerides: 59 mg/dL (ref 0–149)
VLDL Cholesterol Cal: 11 mg/dL (ref 5–40)

## 2019-11-07 LAB — COMPREHENSIVE METABOLIC PANEL
ALT: 16 IU/L (ref 0–44)
AST: 20 IU/L (ref 0–40)
Albumin/Globulin Ratio: 2.3 — ABNORMAL HIGH (ref 1.2–2.2)
Albumin: 4.4 g/dL (ref 3.8–4.9)
Alkaline Phosphatase: 65 IU/L (ref 39–117)
BUN/Creatinine Ratio: 15 (ref 9–20)
BUN: 15 mg/dL (ref 6–24)
Bilirubin Total: 0.6 mg/dL (ref 0.0–1.2)
CO2: 23 mmol/L (ref 20–29)
Calcium: 9.3 mg/dL (ref 8.7–10.2)
Chloride: 103 mmol/L (ref 96–106)
Creatinine, Ser: 1.01 mg/dL (ref 0.76–1.27)
GFR calc Af Amer: 94 mL/min/{1.73_m2} (ref >59)
GFR calc non Af Amer: 81 mL/min/{1.73_m2} (ref >59)
Globulin, Total: 1.9 g/dL (ref 1.5–4.5)
Glucose: 94 mg/dL (ref 65–99)
Potassium: 3.9 mmol/L (ref 3.5–5.2)
Sodium: 140 mmol/L (ref 134–144)
Total Protein: 6.3 g/dL (ref 6.0–8.5)

## 2019-11-07 LAB — CBC WITH DIFFERENTIAL/PLATELET
Basophils Absolute: 0 10*3/uL (ref 0.0–0.2)
Basos: 0 %
EOS (ABSOLUTE): 0.1 10*3/uL (ref 0.0–0.4)
Eos: 2 %
Hematocrit: 41.1 % (ref 37.5–51.0)
Hemoglobin: 14.4 g/dL (ref 13.0–17.7)
Immature Grans (Abs): 0 10*3/uL (ref 0.0–0.1)
Immature Granulocytes: 0 %
Lymphocytes Absolute: 0.9 10*3/uL (ref 0.7–3.1)
Lymphs: 19 %
MCH: 31.9 pg (ref 26.6–33.0)
MCHC: 35 g/dL (ref 31.5–35.7)
MCV: 91 fL (ref 79–97)
Monocytes Absolute: 0.4 10*3/uL (ref 0.1–0.9)
Monocytes: 8 %
Neutrophils Absolute: 3.1 10*3/uL (ref 1.4–7.0)
Neutrophils: 71 %
Platelets: 163 10*3/uL (ref 150–450)
RBC: 4.51 x10E6/uL (ref 4.14–5.80)
RDW: 11.7 % (ref 11.6–15.4)
WBC: 4.5 10*3/uL (ref 3.4–10.8)

## 2019-11-07 LAB — POCT URINALYSIS DIP (PROADVANTAGE DEVICE)
Bilirubin, UA: NEGATIVE
Blood, UA: NEGATIVE
Glucose, UA: NEGATIVE mg/dL
Ketones, POC UA: NEGATIVE mg/dL
Leukocytes, UA: NEGATIVE
Nitrite, UA: NEGATIVE
Protein Ur, POC: NEGATIVE mg/dL
Specific Gravity, Urine: 1.015
Urobilinogen, Ur: 0.2
pH, UA: 7 (ref 5.0–8.0)

## 2019-11-07 MED ORDER — BISOPROLOL-HYDROCHLOROTHIAZIDE 5-6.25 MG PO TABS: 1.0000 | ORAL_TABLET | Freq: Every day | ORAL | 3 refills | Status: DC

## 2019-11-07 MED ORDER — TAMSULOSIN HCL 0.4 MG PO CAPS: 0.4000 mg | ORAL_CAPSULE | Freq: Every day | ORAL | 3 refills | Status: DC

## 2019-11-07 MED ORDER — FINASTERIDE 5 MG PO TABS: 5.0000 mg | ORAL_TABLET | Freq: Every day | ORAL | 3 refills | Status: DC

## 2019-11-07 NOTE — Addendum Note (Signed)
Addended by: Elyse Jarvis on: 11/07/2019 11:30 AM   Modules accepted: Orders

## 2019-11-07 NOTE — Progress Notes (Signed)
Letter mailed to advise pt to schedule a follow in one month with Dr. Redmond School for a b/p check [facility]

## 2019-11-07 NOTE — Progress Notes (Signed)
   Subjective:    Patient ID: Tony Hester, male    DOB: 04-Sep-1961, 58 y.o.   MRN: [id number]  HPI He is here for complete examination.  He has no particular concerns or complaints.  He does have a history of migraine headaches and usually gets 2/week.  These are handled well with Imitrex and ondansetron.  He does not have an aura.  He continues on finasteride and Flomax  and is having no urinary symptoms.  His allergies seem to be under good control.  He continues on his blood pressure medications and having no difficulty with that.  His work and home life are stable.  Family and social history as well as health maintenance and immunizations was reviewed.   Review of Systems  All other systems reviewed and are negative.      Objective:   Physical Exam Alert and in no distress. Tympanic membranes and canals are normal. Pharyngeal area is normal. Neck is supple without adenopathy or thyromegaly. Cardiac exam shows a regular sinus rhythm without murmurs or gallops. Lungs are clear to auscultation. Abdominal exam shows no masses or tenderness. EKG does show IVCD.      Assessment & Plan:  Routine general medical examination at a health care facility - Plan: CBC with Differential, Comprehensive metabolic panel, Lipid panel  Essential hypertension, benign - Plan: EKG 12-Lead, bisoprolol-hydrochlorothiazide (ZIAC) 5-6.25 MG tablet  Migraine with aura and without status migrainosus, not intractable  Benign prostatic hyperplasia, unspecified whether lower urinary tract symptoms present - Plan: tamsulosin (FLOMAX) 0.4 MG CAPS capsule, finasteride (PROSCAR) 5 MG tablet  Allergic rhinitis, unspecified seasonality, unspecified trigger  IVCD (intraventricular conduction defect)  His blood pressure is slightly elevated.  I will have him return here in about 1 month for recheck on his blood pressure

## 2019-11-14 ENCOUNTER — Telehealth: Payer: Self-pay

## 2019-11-14 NOTE — Telephone Encounter (Signed)
Done and appt made for b/p follow . [place]

## 2019-11-14 NOTE — Telephone Encounter (Signed)
Pt. Called stating that he received a letter from Maud[facility] in the mail stating that he needed to get rescheduled for a f/u but wasn't sure what for, Please advise.

## 2019-12-08 ENCOUNTER — Ambulatory Visit: Payer: BC Managed Care – PPO | Admitting: Family Medicine

## 2019-12-08 ENCOUNTER — Other Ambulatory Visit: Payer: Self-pay

## 2019-12-08 ENCOUNTER — Encounter: Payer: Self-pay | Admitting: Family Medicine

## 2019-12-08 VITALS — BP 150/86 | HR 63 | Temp 97.1°F | Wt 163.4 lb

## 2019-12-08 DIAGNOSIS — I10 Essential (primary) hypertension: Secondary | ICD-10-CM | POA: Diagnosis not present

## 2019-12-08 MED ORDER — LOSARTAN POTASSIUM-HCTZ 100-12.5 MG PO TABS: 1.0000 | ORAL_TABLET | Freq: Every day | ORAL | 3 refills | Status: DC

## 2019-12-08 NOTE — Progress Notes (Signed)
   Subjective:    Patient ID: Tony Hester, male    DOB: 07-25-1961, 59 y.o.   MRN: [id number]  HPI He is here for a blood pressure recheck.  He did bring his cuff with him.  It was measured against ours and is accurate.   Review of Systems     Objective:   Physical Exam Alert and in no distress.  Blood pressure is recorded.       Assessment & Plan:  Essential hypertension, benign - Plan: losartan-hydrochlorothiazide (HYZAAR) 100-12.5 MG tablet I will stop the Ziac and switch him to Hyzaar.  He is to check his blood pressure weekly and call me in 1 month with the readings.  Explained that I might possibly even need to add another medication to his regimen.

## 2020-01-17 ENCOUNTER — Other Ambulatory Visit: Payer: Self-pay | Admitting: Family Medicine

## 2020-01-17 NOTE — Telephone Encounter (Signed)
[facility] is requesting to fill pt sumatriptan. Please advise [facility] 

## 2020-02-16 ENCOUNTER — Ambulatory Visit: Payer: BC Managed Care – PPO

## 2020-02-17 ENCOUNTER — Ambulatory Visit: Payer: BC Managed Care – PPO | Attending: Internal Medicine

## 2020-02-17 DIAGNOSIS — Z23 Encounter for immunization: Secondary | ICD-10-CM

## 2020-02-17 NOTE — Progress Notes (Signed)
   Covid-19 Vaccination Clinic  Name:  Tony Hester    MRN: [id number] DOB: 06-02-1961  02/17/2020  Mr. Bocook was observed post Covid-19 immunization for 15 minutes without incident. He was provided with Vaccine Information Sheet and instruction to access the V-Safe system.   Mr. Yoffe was instructed to call 911 with any severe reactions post vaccine: Marland Kitchen Difficulty breathing  . Swelling of face and throat  . A fast heartbeat  . A bad rash all over body  . Dizziness and weakness   Immunizations Administered    Name Date Dose VIS Date Route   Pfizer COVID-19 Vaccine 02/17/2020 10:04 AM 0.3 mL 11/04/2019 Intramuscular   Manufacturer: [place]   Lot: [id number]   [place]: [id number]

## 2020-03-13 ENCOUNTER — Ambulatory Visit: Payer: BC Managed Care – PPO | Attending: Internal Medicine

## 2020-03-13 DIAGNOSIS — Z23 Encounter for immunization: Secondary | ICD-10-CM

## 2020-03-13 NOTE — Progress Notes (Signed)
   Covid-19 Vaccination Clinic  Name:  Tony Hester    MRN: [id number] DOB: [birth year]  03/13/2020  Tony Hester was observed post Covid-19 immunization for 15 minutes without incident. He was provided with Vaccine Information Sheet and instruction to access the V-Safe system.   Tony Hester was instructed to call 911 with any severe reactions post vaccine: Marland Kitchen Difficulty breathing  . Swelling of face and throat  . A fast heartbeat  . A bad rash all over body  . Dizziness and weakness   Immunizations Administered    Name Date Dose VIS Date Route   Pfizer COVID-19 Vaccine 03/13/2020  9:19 AM 0.3 mL 01/18/2019 Intramuscular   Manufacturer: [employer]   Lot: [id number]   [place]: [id number]

## 2020-06-01 ENCOUNTER — Encounter: Payer: Self-pay | Admitting: Family Medicine

## 2020-06-01 ENCOUNTER — Other Ambulatory Visit: Payer: Self-pay

## 2020-06-01 ENCOUNTER — Ambulatory Visit: Payer: BC Managed Care – PPO | Admitting: Family Medicine

## 2020-06-01 VITALS — BP 130/86 | HR 78 | Temp 98.8°F | Wt 161.8 lb

## 2020-06-01 DIAGNOSIS — H9311 Tinnitus, right ear: Secondary | ICD-10-CM

## 2020-06-01 DIAGNOSIS — H6123 Impacted cerumen, bilateral: Secondary | ICD-10-CM | POA: Diagnosis not present

## 2020-06-01 NOTE — Progress Notes (Signed)
   Subjective:    Patient ID: Tony Hester, male    DOB: 10/02/1961, 59 y.o.   MRN: [id number]  HPI He complains of ringing in the ear but mainly on the right.   Review of Systems     Objective:   Physical Exam Alert and in no distress.  Cerumen was removed from both ears.      Assessment & Plan:  Tinnitus of right ear  Bilateral impacted cerumen Recommend conservative care for this however if he continues have difficulty we can refer to ENT if needed.

## 2020-06-14 ENCOUNTER — Encounter: Payer: Self-pay | Admitting: Family Medicine

## 2020-06-14 ENCOUNTER — Ambulatory Visit: Payer: BC Managed Care – PPO | Admitting: Family Medicine

## 2020-06-14 ENCOUNTER — Other Ambulatory Visit: Payer: Self-pay

## 2020-06-14 VITALS — BP 132/82 | HR 81 | Temp 99.1°F | Wt 161.8 lb

## 2020-06-14 DIAGNOSIS — H9311 Tinnitus, right ear: Secondary | ICD-10-CM

## 2020-06-14 DIAGNOSIS — H6691 Otitis media, unspecified, right ear: Secondary | ICD-10-CM | POA: Diagnosis not present

## 2020-06-14 MED ORDER — AMOXICILLIN-POT CLAVULANATE 875-125 MG PO TABS: 1.0000 | ORAL_TABLET | Freq: Two times a day (BID) | ORAL | 0 refills | Status: DC

## 2020-06-14 NOTE — Progress Notes (Signed)
   Subjective:    Patient ID: Tony Hester, male    DOB: [birth year], 60 y.o.   MRN: [id number]  HPI He states that he continues have difficulty with ringing in the right ear and now feels a slight fullness but no pain, sore throat, fever, chills.   Review of Systems     Objective:   Physical Exam Alert and in no distress.  Right TM is slightly dull and retracted.  Canal is normal.  Left TM and canal normal.  Neck is supple without adenopathy or thyromegaly.       Assessment & Plan:  Right otitis media, unspecified otitis media type - Plan: amoxicillin-clavulanate (AUGMENTIN) 875-125 MG tablet  Tinnitus of right ear Also recommend decongestant but discussed with pharmacist due to his underlying hypertension. Return here in 2 weeks to ensure that his TM is back to normal and hopefully elimination of the tinnitus.  If he continues have difficulty with the right sided tinnitus, ENT referral will be made.

## 2020-06-28 ENCOUNTER — Ambulatory Visit: Payer: BC Managed Care – PPO | Admitting: Family Medicine

## 2020-06-28 ENCOUNTER — Encounter: Payer: Self-pay | Admitting: Family Medicine

## 2020-06-28 ENCOUNTER — Other Ambulatory Visit: Payer: Self-pay

## 2020-06-28 VITALS — BP 128/84 | HR 78 | Temp 98.5°F | Ht 70.0 in | Wt 164.6 lb

## 2020-06-28 DIAGNOSIS — H9311 Tinnitus, right ear: Secondary | ICD-10-CM

## 2020-06-28 NOTE — Progress Notes (Signed)
   Subjective:    Patient ID: Tony Hester, male    DOB: 02-17-1961, 59 y.o.   MRN: [id number]  HPI He is here for recheck.  He continues have difficulty with ringing in the right ear.  It apparently does tend to come and go.  He recently finished a course of Augmentin.   Review of Systems     Objective:   Physical Exam Alert and in no distress.  Both TM's and canal looks normal.         Assessment & Plan:  Tinnitus of right ear - Plan: Ambulatory referral to ENT Since he really did not clear with the antibiotic and its unilateral, I think ENT evaluation is appropriate.

## 2020-07-18 ENCOUNTER — Other Ambulatory Visit: Payer: Self-pay | Admitting: Family Medicine

## 2020-07-18 NOTE — Telephone Encounter (Signed)
[facility] is requesting to fill pt sumatriptan. Please advise [facility]

## 2020-07-20 ENCOUNTER — Ambulatory Visit (INDEPENDENT_AMBULATORY_CARE_PROVIDER_SITE_OTHER): Payer: BC Managed Care – PPO | Admitting: Otolaryngology

## 2020-07-20 ENCOUNTER — Other Ambulatory Visit: Payer: Self-pay

## 2020-07-20 ENCOUNTER — Encounter (INDEPENDENT_AMBULATORY_CARE_PROVIDER_SITE_OTHER): Payer: Self-pay | Admitting: Otolaryngology

## 2020-07-20 VITALS — Temp 97.7°F

## 2020-07-20 DIAGNOSIS — H9311 Tinnitus, right ear: Secondary | ICD-10-CM | POA: Diagnosis not present

## 2020-07-20 NOTE — Progress Notes (Signed)
HPI: Tony Hester is a 59 y.o. male who presents is referred by Dr. Redmond School for evaluation of ringing in his ears.  He had wax buildup in his ears that was cleaned but he still complains of intermittent ringing or tinnitus in his ears especially on the right side. states that the right ear feels a little clogged.  He does not really notice any hearing problems.  Denies history of loud noise exposure.  Uses ear protection when he is around loud noise such as mowing the lawn.  Past Medical History:  Diagnosis Date  . Eczema   . Enlarged prostate   . Hypertension   . Migraine headache    Past Surgical History:  Procedure Laterality Date  . bilateral inguinal hernia  2006  . WISDOM TOOTH EXTRACTION  2007   Social History   Socioeconomic History  . Marital status: Married    Spouse name: Not on file  . Number of children: Not on file  . Years of education: Not on file  . Highest education level: Not on file  Occupational History  . Not on file  Tobacco Use  . Smoking status: Never Smoker  . Smokeless tobacco: Never Used  Substance and Sexual Activity  . Alcohol use: Yes    Comment: maybe 2 drinks per month.  . Drug use: No  . Sexual activity: Not Currently  Other Topics Concern  . Not on file  Social History Narrative  . Not on file   Social Determinants of Health   Financial Resource Strain:   . Difficulty of Paying Living Expenses: Not on file  Food Insecurity:   . Worried About Charity fundraiser in the Last Year: Not on file  . Ran Out of Food in the Last Year: Not on file  Transportation Needs:   . Lack of Transportation (Medical): Not on file  . Lack of Transportation (Non-Medical): Not on file  Physical Activity:   . Days of Exercise per Week: Not on file  . Minutes of Exercise per Session: Not on file  Stress:   . Feeling of Stress : Not on file  Social Connections:   . Frequency of Communication with Friends and Family: Not on file  . Frequency of Social  Gatherings with Friends and Family: Not on file  . Attends Religious Services: Not on file  . Active Member of Clubs or Organizations: Not on file  . Attends Archivist Meetings: Not on file  . Marital Status: Not on file   Family History  Problem Relation Age of Onset  . Cancer Father   . Colon cancer Neg Hx   . Stomach cancer Neg Hx    Allergies  Allergen Reactions  . Codeine Nausea And Vomiting  . Percogesic [Diphenhydramine-Apap (Sleep)]     palpitations   Prior to Admission medications   Medication Sig Start Date End Date Taking? Authorizing Provider  aspirin 81 MG tablet Take 81 mg by mouth daily.     Yes [provider]  finasteride (PROSCAR) 5 MG tablet Take 1 tablet (5 mg total) by mouth daily. 11/07/19  Yes Denita Lung, MD  losartan-hydrochlorothiazide (HYZAAR) 100-12.5 MG tablet Take 1 tablet by mouth daily. 12/08/19  Yes Denita Lung, MD  Multiple Vitamin (MULTIVITAMIN) tablet Take 1 tablet by mouth daily.   Yes [provider]  promethazine (PHENERGAN) 25 MG tablet Take 1 tablet (25 mg total) by mouth every 8 (eight) hours as needed for nausea or  vomiting. 09/28/19  Yes Denita Lung, MD  sulfamethoxazole-trimethoprim (BACTRIM DS,SEPTRA DS) 800-160 MG tablet Take 1 tablet by mouth 2 (two) times daily. 08/04/17  Yes Denita Lung, MD  SUMAtriptan (IMITREX) 100 MG tablet TAKE ONE TABLET BY MOUTH AT ONSET OF HEADACHE; MAY REPEAT ONE TABLET IN 2 HOURS IF NEEDED. 07/18/20  Yes Denita Lung, MD  tamsulosin (FLOMAX) 0.4 MG CAPS capsule Take 1 capsule (0.4 mg total) by mouth daily. 11/07/19  Yes Denita Lung, MD  triamcinolone cream (KENALOG) 0.1 % Apply 1 application topically 2 (two) times daily. 02/14/14  Yes Denita Lung, MD  amoxicillin-clavulanate (AUGMENTIN) 875-125 MG tablet Take 1 tablet by mouth 2 (two) times daily. Patient not taking: Reported on 07/20/2020 06/14/20   Denita Lung, MD     Positive ROS: Otherwise  negative  All other systems have been reviewed and were otherwise negative with the exception of those mentioned in the HPI and as above.  Physical Exam: Constitutional: Alert, well-appearing, no acute distress Ears: External ears without lesions or tenderness. Ear canals are clear bilaterally with intact, clear TMs bilaterally with good mobility on pneumatic otoscopy.  Auscultation of the ears revealed no objective tinnitus or pulse. Nasal: External nose without lesions. Clear nasal passages Oral: Lips and gums without lesions. Tongue and palate mucosa without lesions. Posterior oropharynx clear. Neck: No palpable adenopathy or masses Respiratory: Breathing comfortably  Skin: No facial/neck lesions or rash noted.  Audiogram in the office today demonstrated essentially normal hearing in both ears although he has a slight dip in hearing in the 01-3999 frequency range which is consistent with history of noise induced hearing loss although this is minimal.  Pure tones are mostly at 5-10 dB although there was a small drop in both ears to 20 dB at 3000 and 4000 frequency.  He had type A tympanograms bilaterally.  Procedures  Assessment: Right ear tinnitus most likely related to previous history of noise exposure when he was younger which resulted in a very minimal hearing deficit and secondary tinnitus.  Plan: Discussed with Konstantinos that there is limited treatment options for tinnitus.  Discussed with him concerning using masking noise when it is bothersome.  Discussed with him concerning using ear protection when around loud noise which he is already doing.  He will follow-up if the tinnitus worsens significantly.   Radene Journey, MD   CC:

## 2020-07-23 ENCOUNTER — Encounter (INDEPENDENT_AMBULATORY_CARE_PROVIDER_SITE_OTHER): Payer: Self-pay

## 2020-09-07 ENCOUNTER — Other Ambulatory Visit: Payer: Self-pay

## 2020-09-07 ENCOUNTER — Other Ambulatory Visit (INDEPENDENT_AMBULATORY_CARE_PROVIDER_SITE_OTHER): Payer: BC Managed Care – PPO

## 2020-09-07 DIAGNOSIS — Z23 Encounter for immunization: Secondary | ICD-10-CM | POA: Diagnosis not present

## 2020-11-08 ENCOUNTER — Other Ambulatory Visit: Payer: Self-pay

## 2020-11-08 ENCOUNTER — Ambulatory Visit: Payer: BC Managed Care – PPO | Admitting: Family Medicine

## 2020-11-08 ENCOUNTER — Encounter: Payer: Self-pay | Admitting: Family Medicine

## 2020-11-08 VITALS — BP 148/90 | HR 92 | Temp 97.2°F | Ht 69.0 in | Wt 160.2 lb

## 2020-11-08 DIAGNOSIS — N400 Enlarged prostate without lower urinary tract symptoms: Secondary | ICD-10-CM

## 2020-11-08 DIAGNOSIS — Z23 Encounter for immunization: Secondary | ICD-10-CM | POA: Diagnosis not present

## 2020-11-08 DIAGNOSIS — Z13220 Encounter for screening for lipoid disorders: Secondary | ICD-10-CM

## 2020-11-08 DIAGNOSIS — J309 Allergic rhinitis, unspecified: Secondary | ICD-10-CM | POA: Diagnosis not present

## 2020-11-08 DIAGNOSIS — I10 Essential (primary) hypertension: Secondary | ICD-10-CM | POA: Diagnosis not present

## 2020-11-08 DIAGNOSIS — G43109 Migraine with aura, not intractable, without status migrainosus: Secondary | ICD-10-CM

## 2020-11-08 DIAGNOSIS — Z0000 Encounter for general adult medical examination without abnormal findings: Secondary | ICD-10-CM

## 2020-11-08 LAB — CBC WITH DIFFERENTIAL/PLATELET
Basophils Absolute: 0 10*3/uL (ref 0.0–0.2)
Basos: 1 %
EOS (ABSOLUTE): 0.1 10*3/uL (ref 0.0–0.4)
Eos: 3 %
Hematocrit: 44.4 % (ref 37.5–51.0)
Hemoglobin: 15.3 g/dL (ref 13.0–17.7)
Immature Grans (Abs): 0 10*3/uL (ref 0.0–0.1)
Immature Granulocytes: 0 %
Lymphocytes Absolute: 1.1 10*3/uL (ref 0.7–3.1)
Lymphs: 24 %
MCH: 32.2 pg (ref 26.6–33.0)
MCHC: 34.5 g/dL (ref 31.5–35.7)
MCV: 94 fL (ref 79–97)
Monocytes Absolute: 0.5 10*3/uL (ref 0.1–0.9)
Monocytes: 10 %
Neutrophils Absolute: 2.7 10*3/uL (ref 1.4–7.0)
Neutrophils: 62 %
Platelets: 165 10*3/uL (ref 150–450)
RBC: 4.75 x10E6/uL (ref 4.14–5.80)
RDW: 11.5 % — ABNORMAL LOW (ref 11.6–15.4)
WBC: 4.5 10*3/uL (ref 3.4–10.8)

## 2020-11-08 LAB — LIPID PANEL
Chol/HDL Ratio: 2.6 ratio (ref 0.0–5.0)
Cholesterol, Total: 205 mg/dL — ABNORMAL HIGH (ref 100–199)
HDL: 78 mg/dL (ref >39)
LDL Chol Calc (NIH): 118 mg/dL — ABNORMAL HIGH (ref 0–99)
Triglycerides: 51 mg/dL (ref 0–149)
VLDL Cholesterol Cal: 9 mg/dL (ref 5–40)

## 2020-11-08 LAB — COMPREHENSIVE METABOLIC PANEL
ALT: 27 IU/L (ref 0–44)
AST: 27 IU/L (ref 0–40)
Albumin/Globulin Ratio: 2.3 — ABNORMAL HIGH (ref 1.2–2.2)
Albumin: 4.9 g/dL (ref 3.8–4.9)
Alkaline Phosphatase: 76 IU/L (ref 44–121)
BUN/Creatinine Ratio: 14 (ref 10–24)
BUN: 15 mg/dL (ref 8–27)
Bilirubin Total: 1.1 mg/dL (ref 0.0–1.2)
CO2: 26 mmol/L (ref 20–29)
Calcium: 9.9 mg/dL (ref 8.6–10.2)
Chloride: 99 mmol/L (ref 96–106)
Creatinine, Ser: 1.11 mg/dL (ref 0.76–1.27)
GFR calc Af Amer: 83 mL/min/{1.73_m2} (ref >59)
GFR calc non Af Amer: 72 mL/min/{1.73_m2} (ref >59)
Globulin, Total: 2.1 g/dL (ref 1.5–4.5)
Glucose: 96 mg/dL (ref 65–99)
Potassium: 4.2 mmol/L (ref 3.5–5.2)
Sodium: 141 mmol/L (ref 134–144)
Total Protein: 7 g/dL (ref 6.0–8.5)

## 2020-11-08 MED ORDER — FINASTERIDE 5 MG PO TABS: 5.0000 mg | ORAL_TABLET | Freq: Every day | ORAL | 3 refills | Status: DC

## 2020-11-08 MED ORDER — TAMSULOSIN HCL 0.4 MG PO CAPS: 0.4000 mg | ORAL_CAPSULE | Freq: Every day | ORAL | 3 refills | Status: DC

## 2020-11-08 MED ORDER — LOSARTAN POTASSIUM-HCTZ 100-12.5 MG PO TABS: 1.0000 | ORAL_TABLET | Freq: Every day | ORAL | 3 refills | Status: DC

## 2020-11-08 MED ORDER — ONDANSETRON 4 MG PO TBDP
4.0000 mg | ORAL_TABLET | Freq: Three times a day (TID) | ORAL | 0 refills | Status: DC | PRN
Start: 2020-11-08 — End: 2021-11-15

## 2020-11-08 NOTE — Progress Notes (Signed)
   Subjective:    Patient ID: Tony Hester, male    DOB: 1961-08-21, 59 y.o.   MRN: [id number]  HPI He is here for complete examination.  He is now retired and seems to be enjoying this.  He is walking fairly regularly.  His allergies are under good control on his present medication regimen.  He has noted a 50% drop in the number of headaches that he has had and in the pattern.  Now he gets a slight bit of nausea that he treats with Phenergan but is judicious in its use because of sedation side effect.  His BPH is under good control on his present medications.  He has no concerns about that.  He continues on his blood pressure medicine is having no difficulty with that.  Family and social history as well as health maintenance and immunizations was reviewed.  He does need follow-up Covid booster.   Review of Systems  All other systems reviewed and are negative.      Objective:   Physical Exam Alert and in no distress. Tympanic membranes and canals are normal. Pharyngeal area is normal. Neck is supple without adenopathy or thyromegaly. Cardiac exam shows a regular sinus rhythm without murmurs or gallops. Lungs are clear to auscultation. Abdominal exam shows no masses or tenderness.      Assessment & Plan:  Routine general medical examination at a health care facility - Plan: CBC with Differential/Platelet, Comprehensive metabolic panel, Lipid panel  Need for COVID-19 vaccine - Plan: Pfizer SARS-COV-2 Vaccine  Essential hypertension, benign - Plan: losartan-hydrochlorothiazide (HYZAAR) 100-12.5 MG tablet  Migraine with aura and without status migrainosus, not intractable - Plan: ondansetron (ZOFRAN ODT) 4 MG disintegrating tablet  Benign prostatic hyperplasia, unspecified whether lower urinary tract symptoms present - Plan: tamsulosin (FLOMAX) 0.4 MG CAPS capsule, finasteride (PROSCAR) 5 MG tablet  Allergic rhinitis, unspecified seasonality, unspecified trigger  Screening for lipid  disorders - Plan: Lipid panel  I will continue him on his present medication regimen.  Discussed his new lifestyle in regard to both he and his wife being retired and the change in the dynamics of that. I will also switch him to Zofran to help with his nausea and see if that gives relief of symptoms but does not sedate him.

## 2020-11-13 ENCOUNTER — Encounter: Payer: Self-pay | Admitting: Family Medicine

## 2020-12-01 ENCOUNTER — Other Ambulatory Visit: Payer: Self-pay | Admitting: Family Medicine

## 2020-12-01 DIAGNOSIS — I10 Essential (primary) hypertension: Secondary | ICD-10-CM

## 2020-12-21 ENCOUNTER — Other Ambulatory Visit: Payer: Self-pay | Admitting: Family Medicine

## 2020-12-21 NOTE — Telephone Encounter (Signed)
[facility] is requesting to fill pt imitrex. Please advise [facility]

## 2021-01-27 NOTE — Progress Notes (Signed)
Chief Complaint  Patient presents with  . Prostate issues    Feels like he may have some swelling, feels like some days it one side and some days the other. Also experiencing some tingling off and on-goes away over night and once he urinates in the am begins. Symptoms started about 3 weeks ago.    3 weeks ago he started noting some tingling and burning when he urinates (within the urethra). He denies any abdominal pain, flank pain.  He denies any urinary frequency, urgency, hematuria, incontinence. Stream is strong, no hesitancy, feels like he empties well. He denies any penile discharge.  Sometimes he feels some "swelling" at the groin, flips back and forth from side to side, not having currently.  Improves with walking, notices the discomfort mainly when sitting.  Denies any sexual activity in years, no risk for STD.  He had CPE in December with Dr. Susann Givens.  At that point it was reported that his BPH was well controlled with tamsulosin and finasteride. He recalls that prostate exam was not performed at that visit.  Review of chart shows no urinalysis at that time. No PSA done (pt aware of the controversies).  Lab Results  Component Value Date   PSA 0.07 05/13/2016   PSA 0.34 06/30/2012    PMH, PSH, SH reviewed He saw Dr. Berneice Heinrich (at [facility]) in 01/2018.  Cysto for hematuria.  PSA's were UTD at that time. He does not routinely see urologist.  HTN--BP's at home run 135-145/78-90, last checked 2 weeks ago.   PMH, PSH, SH reviewed  Outpatient Encounter Medications as of 01/28/2021  Medication Sig  . aspirin 81 MG tablet Take 81 mg by mouth daily.  . finasteride (PROSCAR) 5 MG tablet Take 1 tablet (5 mg total) by mouth daily.  Marland Kitchen losartan-hydrochlorothiazide (HYZAAR) 100-12.5 MG tablet TAKE ONE TABLET BY MOUTH DAILY  . Multiple Vitamin (MULTIVITAMIN) tablet Take 1 tablet by mouth daily.  . tamsulosin (FLOMAX) 0.4 MG CAPS capsule Take 1 capsule (0.4 mg total) by mouth daily.  .  ondansetron (ZOFRAN ODT) 4 MG disintegrating tablet Take 1 tablet (4 mg total) by mouth every 8 (eight) hours as needed for nausea or vomiting. (Patient not taking: Reported on 01/28/2021)  . SUMAtriptan (IMITREX) 100 MG tablet TAKE ONE TABLET BY MOUTH AT ONSET OF HEADACHE; MAY REPEAT ONE TABLET IN 2 HOURS IF NEEDED. (Patient not taking: Reported on 01/28/2021)  . [DISCONTINUED] amoxicillin-clavulanate (AUGMENTIN) 875-125 MG tablet Take 1 tablet by mouth 2 (two) times daily. (Patient not taking: No sig reported)  . [DISCONTINUED] sulfamethoxazole-trimethoprim (BACTRIM DS,SEPTRA DS) 800-160 MG tablet Take 1 tablet by mouth 2 (two) times daily. (Patient not taking: Reported on 11/08/2020)  . [DISCONTINUED] triamcinolone cream (KENALOG) 0.1 % Apply 1 application topically 2 (two) times daily. (Patient not taking: Reported on 11/08/2020)   No facility-administered encounter medications on file as of 01/28/2021.   Allergies  Allergen Reactions  . Codeine Nausea And Vomiting  . Percogesic [Diphenhydramine-Apap (Sleep)]     palpitations   ROS: no fever, chills, URI symptoms, headaches, chest pain, shortness of breath, n/v/d. See HPI for urinary complaints.   PHYSICAL EXAM:  BP (!) 160/98   Pulse 80   Temp 98.8 F (37.1 C) (Tympanic)   Ht 5\' 9"  (1.753 m)   Wt 168 lb (76.2 kg)   BMI 24.81 kg/m   Wt Readings from Last 3 Encounters:  01/28/21 168 lb (76.2 kg)  11/08/20 160 lb 3.2 oz (72.7 kg)  06/28/20 164  lb 9.6 oz (74.7 kg)   Well-appearing, somewhat anxious male, in no distress HEENT: conjunctiva and sclera are clear, EOMI, wearing mask. Neck: no lymphadenopathy, thyromegaly or mass Heart: regular rate and rhythm Lungs: clear bilaterally Back: no CVA tenderness Abdomen: soft, nontender, no mass No inguinal lymphadenopathy GU: Urethra appears normal, no discharge, no lesions. Testicles descended bilaterally, no masses.  No inguinal hernias. No tenderness in inguinal canal. Prostate is  smooth, mildly enlarged, symmetric. No nodules. Normal sphincter tone, heme negative stool. No mass Psych: normal mood, affect, hygiene and grooming Neuro: alert and oriented, normal gait, strength  Urine dip: SG 1.020, otherwise normal (no blood, protein, leuks)   ASSESSMENT/PLAN:  Burning with urination - discomfort in urethra, without other associated urinary symptoms.  Nomral u/a, prostate exam. Increase fluid intake, f/u if persists/worsens - Plan: POCT Urinalysis DIP (Proadvantage Device)  Screening for prostate cancer - reviewed potential risks/benefits of PSA screening in detail, and pt elects to have PSA performed today - Plan: PSA  Essential hypertension, benign - BP high today, component of anxiety. Encouraged low Na diet, routine monitoring and f/u with Dr. Susann Givens if remains elevated   I spent 40 minutes dedicated to the care of this patient, including pre-visit review of records, face to face time, post-visit ordering of testing and documentation.

## 2021-01-28 ENCOUNTER — Encounter: Payer: Self-pay | Admitting: Family Medicine

## 2021-01-28 ENCOUNTER — Other Ambulatory Visit: Payer: Self-pay

## 2021-01-28 ENCOUNTER — Ambulatory Visit: Payer: BC Managed Care – PPO | Admitting: Family Medicine

## 2021-01-28 VITALS — BP 160/98 | HR 80 | Temp 98.8°F | Ht 69.0 in | Wt 168.0 lb

## 2021-01-28 DIAGNOSIS — R300 Dysuria: Secondary | ICD-10-CM

## 2021-01-28 DIAGNOSIS — Z125 Encounter for screening for malignant neoplasm of prostate: Secondary | ICD-10-CM

## 2021-01-28 DIAGNOSIS — I10 Essential (primary) hypertension: Secondary | ICD-10-CM | POA: Diagnosis not present

## 2021-01-28 LAB — POCT URINALYSIS DIP (PROADVANTAGE DEVICE)
Bilirubin, UA: NEGATIVE
Blood, UA: NEGATIVE
Glucose, UA: NEGATIVE mg/dL
Ketones, POC UA: NEGATIVE mg/dL
Leukocytes, UA: NEGATIVE
Nitrite, UA: NEGATIVE
Protein Ur, POC: NEGATIVE mg/dL
Specific Gravity, Urine: 1.02
Urobilinogen, Ur: NEGATIVE
pH, UA: 6.5 (ref 5.0–8.0)

## 2021-01-28 NOTE — Patient Instructions (Addendum)
Continue to monitor your blood pressure at home. Goal is <130/80.  If you are consistently seeing 135-140's/85--90's, your medication should be changed. The pharmacies are having issues getting losartan (alone and in the combination with HCTZ) due to recalls.  We may end up needing to switch medications--either for more efficacy, or because the losartan isn't available.  Record your blood pressure on the sheet provided, and bring it with you (or attach to message) to discuss with Dr. Redmond School.  Be sure to drink plenty of water every day.

## 2021-01-29 LAB — PSA: Prostate Specific Ag, Serum: 0.2 ng/mL (ref 0.0–4.0)

## 2021-05-24 ENCOUNTER — Other Ambulatory Visit: Payer: Self-pay | Admitting: Family Medicine

## 2021-08-29 ENCOUNTER — Ambulatory Visit (INDEPENDENT_AMBULATORY_CARE_PROVIDER_SITE_OTHER): Payer: BC Managed Care – PPO

## 2021-08-29 DIAGNOSIS — Z23 Encounter for immunization: Secondary | ICD-10-CM | POA: Diagnosis not present

## 2021-10-22 ENCOUNTER — Other Ambulatory Visit: Payer: Self-pay | Admitting: Family Medicine

## 2021-10-22 NOTE — Telephone Encounter (Signed)
[facility] is requesting to fill pt imitrex and phenergan. Please advise [facility]

## 2021-11-15 ENCOUNTER — Ambulatory Visit (INDEPENDENT_AMBULATORY_CARE_PROVIDER_SITE_OTHER): Payer: BC Managed Care – PPO | Admitting: Family Medicine

## 2021-11-15 ENCOUNTER — Other Ambulatory Visit: Payer: Self-pay | Admitting: Family Medicine

## 2021-11-15 ENCOUNTER — Encounter: Payer: Self-pay | Admitting: Family Medicine

## 2021-11-15 ENCOUNTER — Other Ambulatory Visit: Payer: Self-pay

## 2021-11-15 VITALS — BP 124/76 | HR 93 | Temp 98.3°F | Ht 70.5 in | Wt 167.6 lb

## 2021-11-15 DIAGNOSIS — Z0000 Encounter for general adult medical examination without abnormal findings: Secondary | ICD-10-CM

## 2021-11-15 DIAGNOSIS — N400 Enlarged prostate without lower urinary tract symptoms: Secondary | ICD-10-CM

## 2021-11-15 DIAGNOSIS — I10 Essential (primary) hypertension: Secondary | ICD-10-CM

## 2021-11-15 DIAGNOSIS — G43109 Migraine with aura, not intractable, without status migrainosus: Secondary | ICD-10-CM | POA: Diagnosis not present

## 2021-11-15 DIAGNOSIS — J309 Allergic rhinitis, unspecified: Secondary | ICD-10-CM

## 2021-11-15 DIAGNOSIS — Z13220 Encounter for screening for lipoid disorders: Secondary | ICD-10-CM | POA: Diagnosis not present

## 2021-11-15 MED ORDER — LOSARTAN POTASSIUM-HCTZ 100-12.5 MG PO TABS
1.0000 | ORAL_TABLET | Freq: Every day | ORAL | 3 refills | Status: DC
Start: 2021-11-15 — End: 2022-11-10

## 2021-11-15 MED ORDER — SUMATRIPTAN SUCCINATE 100 MG PO TABS: ORAL_TABLET | ORAL | 1 refills | Status: DC

## 2021-11-15 MED ORDER — TAMSULOSIN HCL 0.4 MG PO CAPS
0.4000 mg | ORAL_CAPSULE | Freq: Every day | ORAL | 3 refills | Status: DC
Start: 2021-11-15 — End: 2022-12-10

## 2021-11-15 MED ORDER — FINASTERIDE 5 MG PO TABS: 5.0000 mg | ORAL_TABLET | Freq: Every day | ORAL | 3 refills | Status: DC

## 2021-11-15 MED ORDER — ONDANSETRON 4 MG PO TBDP: 4.0000 mg | ORAL_TABLET | Freq: Three times a day (TID) | ORAL | 0 refills | Status: AC | PRN

## 2021-11-15 NOTE — Progress Notes (Signed)
° °  Subjective:    Patient ID: Tony Hester, male    DOB: November 01, 1961, 60 y.o.   MRN: [id number]  HPI He is here for complete examination.  He is now retired and enjoying his retirement.  He does have a history of migraine headaches and would like his medications renewed as well as something for nausea that he gets with these.  He continues on losartan/HCTZ with no difficulty.  Also his finasteride and tamsulosin center is helping with his urologic symptoms.  His allergies seem to be under good control.  He has no other concerns or complaints.  Family and social history as well as health maintenance and immunizations was reviewed.   Review of Systems  All other systems reviewed and are negative.     Objective:   Physical Exam Alert and in no distress. Tympanic membranes and canals are normal. Pharyngeal area is normal. Neck is supple without adenopathy or thyromegaly. Cardiac exam shows a regular sinus rhythm without murmurs or gallops. Lungs are clear to auscultation.        Assessment & Plan:  Routine general medical examination at a health care facility - Plan: Comprehensive metabolic panel, Lipid panel, CBC with Differential/Platelet  Essential hypertension, benign - Plan: losartan-hydrochlorothiazide (HYZAAR) 100-12.5 MG tablet  Migraine with aura and without status migrainosus, not intractable - Plan: SUMAtriptan (IMITREX) 100 MG tablet, ondansetron (ZOFRAN ODT) 4 MG disintegrating tablet  Screening for lipid disorders  Benign prostatic hyperplasia, unspecified whether lower urinary tract symptoms present - Plan: finasteride (PROSCAR) 5 MG tablet, tamsulosin (FLOMAX) 0.4 MG CAPS capsule  Allergic rhinitis, unspecified seasonality, unspecified trigger He is doing quite well on his present medication regimen and I will continue this.  Ondansetron called in for his nausea.  He will let me know if it works as well as Phenergan.

## 2021-11-16 LAB — LIPID PANEL W/O CHOL/HDL RATIO
Cholesterol, Total: 191 mg/dL (ref 100–199)
HDL: 71 mg/dL (ref >39)
LDL Chol Calc (NIH): 110 mg/dL — ABNORMAL HIGH (ref 0–99)
Triglycerides: 51 mg/dL (ref 0–149)
VLDL Cholesterol Cal: 10 mg/dL (ref 5–40)

## 2021-11-16 LAB — CBC WITH DIFFERENTIAL/PLATELET
Basophils Absolute: 0 10*3/uL (ref 0.0–0.2)
Basos: 1 %
EOS (ABSOLUTE): 0.2 10*3/uL (ref 0.0–0.4)
Eos: 4 %
Hematocrit: 43.6 % (ref 37.5–51.0)
Hemoglobin: 14.8 g/dL (ref 13.0–17.7)
Immature Grans (Abs): 0 10*3/uL (ref 0.0–0.1)
Immature Granulocytes: 0 %
Lymphocytes Absolute: 1 10*3/uL (ref 0.7–3.1)
Lymphs: 22 %
MCH: 32.1 pg (ref 26.6–33.0)
MCHC: 33.9 g/dL (ref 31.5–35.7)
MCV: 95 fL (ref 79–97)
Monocytes Absolute: 0.3 10*3/uL (ref 0.1–0.9)
Monocytes: 7 %
Neutrophils Absolute: 2.8 10*3/uL (ref 1.4–7.0)
Neutrophils: 66 %
Platelets: 187 10*3/uL (ref 150–450)
RBC: 4.61 x10E6/uL (ref 4.14–5.80)
RDW: 12 % (ref 11.6–15.4)
WBC: 4.3 10*3/uL (ref 3.4–10.8)

## 2021-11-16 LAB — COMPREHENSIVE METABOLIC PANEL
ALT: 19 IU/L (ref 0–44)
AST: 18 IU/L (ref 0–40)
Albumin/Globulin Ratio: 2.4 — ABNORMAL HIGH (ref 1.2–2.2)
Albumin: 4.8 g/dL (ref 3.8–4.8)
Alkaline Phosphatase: 74 IU/L (ref 44–121)
BUN/Creatinine Ratio: 15 (ref 10–24)
BUN: 16 mg/dL (ref 8–27)
Bilirubin Total: 0.7 mg/dL (ref 0.0–1.2)
CO2: 28 mmol/L (ref 20–29)
Calcium: 9.5 mg/dL (ref 8.6–10.2)
Chloride: 100 mmol/L (ref 96–106)
Creatinine, Ser: 1.04 mg/dL (ref 0.76–1.27)
Globulin, Total: 2 g/dL (ref 1.5–4.5)
Glucose: 93 mg/dL (ref 70–99)
Potassium: 4.1 mmol/L (ref 3.5–5.2)
Sodium: 140 mmol/L (ref 134–144)
Total Protein: 6.8 g/dL (ref 6.0–8.5)
eGFR: 82 mL/min/{1.73_m2} (ref >59)

## 2022-07-09 ENCOUNTER — Other Ambulatory Visit: Payer: Self-pay | Admitting: Family Medicine

## 2022-07-09 DIAGNOSIS — G43109 Migraine with aura, not intractable, without status migrainosus: Secondary | ICD-10-CM

## 2022-07-30 ENCOUNTER — Encounter: Payer: Self-pay | Admitting: Internal Medicine

## 2022-09-15 ENCOUNTER — Encounter: Payer: Self-pay | Admitting: Internal Medicine

## 2022-09-16 ENCOUNTER — Other Ambulatory Visit (INDEPENDENT_AMBULATORY_CARE_PROVIDER_SITE_OTHER): Payer: BC Managed Care – PPO

## 2022-09-16 DIAGNOSIS — Z23 Encounter for immunization: Secondary | ICD-10-CM

## 2022-09-17 ENCOUNTER — Encounter: Payer: Self-pay | Admitting: Internal Medicine

## 2022-11-09 ENCOUNTER — Other Ambulatory Visit: Payer: Self-pay | Admitting: Family Medicine

## 2022-11-09 DIAGNOSIS — I10 Essential (primary) hypertension: Secondary | ICD-10-CM

## 2022-11-10 NOTE — Telephone Encounter (Signed)
Has upcoming appt next month 

## 2022-11-11 ENCOUNTER — Other Ambulatory Visit: Payer: Self-pay | Admitting: Family Medicine

## 2022-11-11 DIAGNOSIS — G43109 Migraine with aura, not intractable, without status migrainosus: Secondary | ICD-10-CM

## 2022-12-09 ENCOUNTER — Other Ambulatory Visit: Payer: Self-pay | Admitting: Family Medicine

## 2022-12-09 DIAGNOSIS — I10 Essential (primary) hypertension: Secondary | ICD-10-CM

## 2022-12-10 ENCOUNTER — Other Ambulatory Visit: Payer: Self-pay | Admitting: Family Medicine

## 2022-12-10 DIAGNOSIS — N400 Enlarged prostate without lower urinary tract symptoms: Secondary | ICD-10-CM

## 2022-12-24 ENCOUNTER — Ambulatory Visit: Payer: BC Managed Care – PPO | Admitting: Family Medicine

## 2022-12-24 ENCOUNTER — Encounter: Payer: Self-pay | Admitting: Family Medicine

## 2022-12-24 VITALS — BP 132/82 | HR 76 | Temp 98.2°F | Ht 69.5 in | Wt 167.0 lb

## 2022-12-24 DIAGNOSIS — I10 Essential (primary) hypertension: Secondary | ICD-10-CM

## 2022-12-24 DIAGNOSIS — G43109 Migraine with aura, not intractable, without status migrainosus: Secondary | ICD-10-CM | POA: Diagnosis not present

## 2022-12-24 DIAGNOSIS — J309 Allergic rhinitis, unspecified: Secondary | ICD-10-CM

## 2022-12-24 DIAGNOSIS — M1990 Unspecified osteoarthritis, unspecified site: Secondary | ICD-10-CM

## 2022-12-24 DIAGNOSIS — Z0000 Encounter for general adult medical examination without abnormal findings: Secondary | ICD-10-CM | POA: Diagnosis not present

## 2022-12-24 DIAGNOSIS — Z13220 Encounter for screening for lipoid disorders: Secondary | ICD-10-CM

## 2022-12-24 DIAGNOSIS — N400 Enlarged prostate without lower urinary tract symptoms: Secondary | ICD-10-CM | POA: Diagnosis not present

## 2022-12-24 LAB — CBC WITH DIFFERENTIAL/PLATELET
Basophils Absolute: 0 10*3/uL (ref 0.0–0.2)
Basos: 1 %
EOS (ABSOLUTE): 0.2 10*3/uL (ref 0.0–0.4)
Eos: 4 %
Hematocrit: 43.4 % (ref 37.5–51.0)
Hemoglobin: 14.9 g/dL (ref 13.0–17.7)
Immature Grans (Abs): 0 10*3/uL (ref 0.0–0.1)
Immature Granulocytes: 0 %
Lymphocytes Absolute: 0.9 10*3/uL (ref 0.7–3.1)
Lymphs: 21 %
MCH: 32.3 pg (ref 26.6–33.0)
MCHC: 34.3 g/dL (ref 31.5–35.7)
MCV: 94 fL (ref 79–97)
Monocytes Absolute: 0.4 10*3/uL (ref 0.1–0.9)
Monocytes: 9 %
Neutrophils Absolute: 2.9 10*3/uL (ref 1.4–7.0)
Neutrophils: 65 %
Platelets: 164 10*3/uL (ref 150–450)
RBC: 4.62 x10E6/uL (ref 4.14–5.80)
RDW: 11.5 % — ABNORMAL LOW (ref 11.6–15.4)
WBC: 4.4 10*3/uL (ref 3.4–10.8)

## 2022-12-24 LAB — COMPREHENSIVE METABOLIC PANEL
ALT: 18 IU/L (ref 0–44)
AST: 20 IU/L (ref 0–40)
Albumin/Globulin Ratio: 2.7 — ABNORMAL HIGH (ref 1.2–2.2)
Albumin: 4.8 g/dL (ref 3.9–4.9)
Alkaline Phosphatase: 72 IU/L (ref 44–121)
BUN/Creatinine Ratio: 13 (ref 10–24)
BUN: 15 mg/dL (ref 8–27)
Bilirubin Total: 0.8 mg/dL (ref 0.0–1.2)
CO2: 24 mmol/L (ref 20–29)
Calcium: 9.4 mg/dL (ref 8.6–10.2)
Chloride: 104 mmol/L (ref 96–106)
Creatinine, Ser: 1.12 mg/dL (ref 0.76–1.27)
Globulin, Total: 1.8 g/dL (ref 1.5–4.5)
Glucose: 97 mg/dL (ref 70–99)
Potassium: 4 mmol/L (ref 3.5–5.2)
Sodium: 143 mmol/L (ref 134–144)
Total Protein: 6.6 g/dL (ref 6.0–8.5)
eGFR: 74 mL/min/{1.73_m2} (ref >59)

## 2022-12-24 LAB — LIPID PANEL
Chol/HDL Ratio: 2.7 ratio (ref 0.0–5.0)
Cholesterol, Total: 181 mg/dL (ref 100–199)
HDL: 66 mg/dL (ref >39)
LDL Chol Calc (NIH): 102 mg/dL — ABNORMAL HIGH (ref 0–99)
Triglycerides: 67 mg/dL (ref 0–149)
VLDL Cholesterol Cal: 13 mg/dL (ref 5–40)

## 2022-12-24 MED ORDER — LOSARTAN POTASSIUM-HCTZ 100-12.5 MG PO TABS: 1.0000 | ORAL_TABLET | Freq: Every day | ORAL | 0 refills | Status: DC

## 2022-12-24 MED ORDER — FINASTERIDE 5 MG PO TABS: 5.0000 mg | ORAL_TABLET | Freq: Every day | ORAL | 0 refills | Status: DC

## 2022-12-24 MED ORDER — TAMSULOSIN HCL 0.4 MG PO CAPS: 0.4000 mg | ORAL_CAPSULE | Freq: Every day | ORAL | 0 refills | Status: DC

## 2022-12-24 NOTE — Progress Notes (Signed)
Complete physical exam  Patient: Tony Hester   DOB: 10/18/1961   62 y.o. Male  MRN: [id number]  Subjective:    Chief Complaint  Patient presents with   Annual Exam    CPE fasting labs, arthritis in the hands possibly hands have been a little stiff,     Tony Hester is a 62 y.o. male who presents today for a complete physical exam. He reports consuming a general diet. Home exercise routine includes walking 1 hrs per day. He generally feels well. He reports sleeping well. He does have additional problems to discuss today.  He is starting to note some arthritic type symptoms in his hands.  Presently he is not taking any medication and this does not interfere with his ADLs.  His allergies seem to be under good control.  He continues on his losartan/HCTZ.  He is also taking finasteride and Flomax for his bladder issues and has this under good control.  He uses Imitrex for his migraines and does not need a refill.  He did have a colonoscopy in 2013.  He has no other concerns or complaints.  Family and social history as well as health maintenance and immunizations was reviewed.  He is now retired and seems to be enjoying his retirement.   Most recent fall risk assessment:    12/24/2022    8:18 AM  [place] in the past year? 0  Number falls in past yr: 0  Injury with Fall? 0  Risk for fall due to : No Fall Risks  Follow up Falls evaluation completed     Most recent depression screenings:    12/24/2022    8:18 AM 11/15/2021    8:30 AM  PHQ 2/9 Scores  PHQ - 2 Score 0 0        Patient Care Team: Denita Lung, MD as PCP - General (Family Medicine)   Outpatient Medications Prior to Visit  Medication Sig   aspirin 81 MG tablet Take 81 mg by mouth daily.   Multiple Vitamin (MULTIVITAMIN) tablet Take 1 tablet by mouth daily.   SUMAtriptan (IMITREX) 100 MG tablet TAKE 1 TABLET BY MOUTH AT ONSET OF HEADACHE; MAY REPEAT 1 TABLET IN 2 HOURS IF NEEDED.   ondansetron (ZOFRAN ODT)  4 MG disintegrating tablet Take 1 tablet (4 mg total) by mouth every 8 (eight) hours as needed for nausea or vomiting. (Patient not taking: Reported on 12/24/2022)   promethazine (PHENERGAN) 25 MG tablet TAKE ONE TABLET BY MOUTH EVERY 8 HOURS AS NEEDED FOR NAUSEA AND VOMITING (Patient not taking: Reported on 11/15/2021)   [DISCONTINUED] finasteride (PROSCAR) 5 MG tablet TAKE ONE TABLET BY MOUTH DAILY   [DISCONTINUED] losartan-hydrochlorothiazide (HYZAAR) 100-12.5 MG tablet TAKE 1 TABLET BY MOUTH DAILY   [DISCONTINUED] tamsulosin (FLOMAX) 0.4 MG CAPS capsule TAKE ONE CAPSULE BY MOUTH DAILY   No facility-administered medications prior to visit.    Review of Systems  All other systems reviewed and are negative.         Objective:     BP 132/82   Pulse 76   Temp 98.2 F (36.8 C)   Ht 5' 9.5" (1.765 m)   Wt 167 lb (75.8 kg)   BMI 24.31 kg/m    Physical Exam  Alert and in no distress. Tympanic membranes and canals are normal. Pharyngeal area is normal. Neck is supple without adenopathy or thyromegaly. Cardiac exam shows a regular sinus rhythm without murmurs or gallops. Lungs are clear  to auscultation. Abdominal exam shows no masses or tenderness.     Assessment & Plan:    Routine general medical examination at a health care facility - Plan: Comprehensive metabolic panel, CBC with Differential/Platelet, Lipid panel  Essential hypertension, benign - Plan: losartan-hydrochlorothiazide (HYZAAR) 100-12.5 MG tablet, Comprehensive metabolic panel, CBC with Differential/Platelet  Migraine with aura and without status migrainosus, not intractable  Allergic rhinitis, unspecified seasonality, unspecified trigger  Benign prostatic hyperplasia, unspecified whether lower urinary tract symptoms present - Plan: finasteride (PROSCAR) 5 MG tablet, tamsulosin (FLOMAX) 0.4 MG CAPS capsule  Screening for lipid disorders - Plan: Lipid panel  Arthritis Discussed arthritis with him in regard to  use of Tylenol on an as-needed basis.  I then discussed colonoscopy with him and at this point he would like to hold off on doing the Cologuard or colonoscopy.  He recognizes the risk of doing this.  His concern is the cost of colonoscopy if he has a positive Cologuard requiring it.  Immunization History  Administered Date(s) Administered   COVID-19, mRNA, vaccine(Comirnaty)12 years and older 09/16/2022   Influenza Split 09/22/2012   Influenza,inj,Quad PF,6+ Mos 08/26/2019, 09/07/2020, 08/29/2021, 09/16/2022   Influenza-Unspecified 09/22/2014, 08/26/2016   PFIZER(Purple Top)SARS-COV-2 Vaccination 02/17/2020, 03/13/2020, 11/08/2020   Pfizer Covid-19 Vaccine Bivalent Booster 53yr & up 08/29/2021   Tdap 05/01/2008, 11/04/2018   Zoster Recombinat (Shingrix) 10/28/2017, 12/30/2017    Health Maintenance  Topic Date Due   COLONOSCOPY (Pts [phone] coverage will need to be confirmed)  08/26/2022   HIV Screening  11/22/2024 (Originally 01/21/1976)   DTaP/Tdap/Td (3 - Td or Tdap) 11/04/2028   INFLUENZA VACCINE  Completed   COVID-19 Vaccine  Completed   Hepatitis C Screening  Completed   Zoster Vaccines- Shingrix  Completed   HPV VACCINES  Aged Out    Discussed health benefits of physical activity, and encouraged him to engage in regular exercise appropriate for his age and condition.  Problem List Items Addressed This Visit     Allergic rhinitis   BPH (benign prostatic hyperplasia)   Relevant Medications   finasteride (PROSCAR) 5 MG tablet   tamsulosin (FLOMAX) 0.4 MG CAPS capsule   Essential hypertension, benign   Relevant Medications   losartan-hydrochlorothiazide (HYZAAR) 100-12.5 MG tablet   Other Relevant Orders   Comprehensive metabolic panel   CBC with Differential/Platelet   Migraine headache   Relevant Medications   losartan-hydrochlorothiazide (HYZAAR) 100-12.5 MG tablet   Other Visit Diagnoses     Routine general medical examination at a health care facility     -  Primary   Relevant Orders   Comprehensive metabolic panel   CBC with Differential/Platelet   Lipid panel   Screening for lipid disorders       Relevant Orders   Lipid panel      Follow-up 1 year.    JoJill AlexandersMD

## 2023-02-02 ENCOUNTER — Other Ambulatory Visit: Payer: Self-pay | Admitting: Family Medicine

## 2023-02-02 DIAGNOSIS — I10 Essential (primary) hypertension: Secondary | ICD-10-CM

## 2023-02-02 DIAGNOSIS — N400 Enlarged prostate without lower urinary tract symptoms: Secondary | ICD-10-CM

## 2023-03-09 ENCOUNTER — Other Ambulatory Visit: Payer: Self-pay | Admitting: Family Medicine

## 2023-03-09 DIAGNOSIS — G43109 Migraine with aura, not intractable, without status migrainosus: Secondary | ICD-10-CM

## 2023-05-22 ENCOUNTER — Other Ambulatory Visit: Payer: Self-pay | Admitting: Family Medicine

## 2023-05-22 DIAGNOSIS — G43109 Migraine with aura, not intractable, without status migrainosus: Secondary | ICD-10-CM

## 2023-05-22 NOTE — Telephone Encounter (Signed)
Refill request last apt 12/24/22.

## 2023-08-01 ENCOUNTER — Other Ambulatory Visit: Payer: Self-pay | Admitting: Family Medicine

## 2023-08-01 DIAGNOSIS — N400 Enlarged prostate without lower urinary tract symptoms: Secondary | ICD-10-CM

## 2023-08-01 DIAGNOSIS — I10 Essential (primary) hypertension: Secondary | ICD-10-CM

## 2023-08-04 ENCOUNTER — Other Ambulatory Visit: Payer: Self-pay | Admitting: Family Medicine

## 2023-08-04 DIAGNOSIS — G43109 Migraine with aura, not intractable, without status migrainosus: Secondary | ICD-10-CM

## 2023-09-22 ENCOUNTER — Other Ambulatory Visit (INDEPENDENT_AMBULATORY_CARE_PROVIDER_SITE_OTHER): Payer: BC Managed Care – PPO

## 2023-09-22 DIAGNOSIS — Z23 Encounter for immunization: Secondary | ICD-10-CM

## 2023-10-31 ENCOUNTER — Other Ambulatory Visit: Payer: Self-pay | Admitting: Family Medicine

## 2023-10-31 DIAGNOSIS — N400 Enlarged prostate without lower urinary tract symptoms: Secondary | ICD-10-CM

## 2023-10-31 DIAGNOSIS — I10 Essential (primary) hypertension: Secondary | ICD-10-CM

## 2023-11-05 ENCOUNTER — Other Ambulatory Visit: Payer: Self-pay | Admitting: Family Medicine

## 2023-11-05 DIAGNOSIS — G43109 Migraine with aura, not intractable, without status migrainosus: Secondary | ICD-10-CM

## 2024-01-07 ENCOUNTER — Telehealth: Payer: Self-pay

## 2024-01-07 ENCOUNTER — Other Ambulatory Visit (HOSPITAL_COMMUNITY): Payer: Self-pay

## 2024-01-07 ENCOUNTER — Ambulatory Visit: Payer: BC Managed Care – PPO | Admitting: Family Medicine

## 2024-01-07 ENCOUNTER — Encounter: Payer: Self-pay | Admitting: Family Medicine

## 2024-01-07 VITALS — BP 140/80 | HR 77 | Ht 69.0 in | Wt 157.2 lb

## 2024-01-07 DIAGNOSIS — N400 Enlarged prostate without lower urinary tract symptoms: Secondary | ICD-10-CM

## 2024-01-07 DIAGNOSIS — I10 Essential (primary) hypertension: Secondary | ICD-10-CM

## 2024-01-07 DIAGNOSIS — M1990 Unspecified osteoarthritis, unspecified site: Secondary | ICD-10-CM

## 2024-01-07 DIAGNOSIS — J301 Allergic rhinitis due to pollen: Secondary | ICD-10-CM

## 2024-01-07 DIAGNOSIS — Z0000 Encounter for general adult medical examination without abnormal findings: Secondary | ICD-10-CM

## 2024-01-07 DIAGNOSIS — Z13220 Encounter for screening for lipoid disorders: Secondary | ICD-10-CM

## 2024-01-07 DIAGNOSIS — Z1211 Encounter for screening for malignant neoplasm of colon: Secondary | ICD-10-CM

## 2024-01-07 DIAGNOSIS — G43009 Migraine without aura, not intractable, without status migrainosus: Secondary | ICD-10-CM

## 2024-01-07 DIAGNOSIS — G43109 Migraine with aura, not intractable, without status migrainosus: Secondary | ICD-10-CM

## 2024-01-07 LAB — LIPID PANEL

## 2024-01-07 MED ORDER — TAMSULOSIN HCL 0.4 MG PO CAPS: 0.4000 mg | ORAL_CAPSULE | Freq: Every day | ORAL | 3 refills | Status: AC

## 2024-01-07 MED ORDER — FINASTERIDE 5 MG PO TABS: 5.0000 mg | ORAL_TABLET | Freq: Every day | ORAL | 3 refills | Status: AC

## 2024-01-07 MED ORDER — LOSARTAN POTASSIUM-HCTZ 100-12.5 MG PO TABS: 1.0000 | ORAL_TABLET | Freq: Every day | ORAL | 3 refills | Status: AC

## 2024-01-07 MED ORDER — PROMETHAZINE HCL 25 MG PO TABS: 25.0000 mg | ORAL_TABLET | Freq: Four times a day (QID) | ORAL | 0 refills | Status: AC | PRN

## 2024-01-07 MED ORDER — SUMATRIPTAN SUCCINATE 100 MG PO TABS: ORAL_TABLET | ORAL | 1 refills | Status: DC

## 2024-01-07 NOTE — Progress Notes (Signed)
Complete physical exam  Patient: Tony Hester   DOB: 02-11-1961   63 y.o. Male  MRN: [id number]  Subjective:    Chief Complaint  Patient presents with   Annual Exam    Fasting annual exan,no concerns.     Tony Hester is a 63 y.o. male who presents today for a complete physical exam.  He reports consuming a general diet.  Daily walk x 1hr. He generally feels well. He reports sleeping well.  He has noted some difficulty with arthritic symptoms in his hands but is not to the point where it is giving him a lot of trouble.  He does have a history of migraine headaches and does use Imitrex and also sometimes needs nausea medication.  He states that the Phenergan seems to do a better job to help with the nausea.  He continues on finasteride and tamsulosin for his BPH symptoms.  He is also on losartan/HCTZ for his blood pressure.  His allergies seem to be under good control.  He is now retired and enjoying his retirement.  He exercises regularly.  Most recent fall risk assessment:    01/07/2024    8:15 AM  Fall Risk   Falls in the past year? 0  Number falls in past yr: 0  Injury with Fall? 0  Risk for fall due to : No Fall Risks  Follow up Falls evaluation completed     Most recent depression screenings:    01/07/2024    8:15 AM 12/24/2022    8:18 AM  PHQ 2/9 Scores  PHQ - 2 Score 0 0    Vision:Not within last year     Immunization History  Administered Date(s) Administered   Influenza Split 09/22/2012   Influenza, Seasonal, Injecte, Preservative Fre 09/22/2023   Influenza,inj,Quad PF,6+ Mos 08/26/2019, 09/07/2020, 08/29/2021, 09/16/2022   Influenza-Unspecified 09/22/2014, 08/26/2016   PFIZER(Purple Top)SARS-COV-2 Vaccination 02/17/2020, 03/13/2020, 11/08/2020   Pfizer Covid-19 Vaccine Bivalent Booster 99yrs & up 08/29/2021   Pfizer(Comirnaty)Fall Seasonal Vaccine 12 years and older 09/16/2022, 09/22/2023   Tdap 05/01/2008, 11/04/2018   Zoster Recombinant(Shingrix) 10/28/2017,  12/30/2017    Health Maintenance  Topic Date Due   Colonoscopy  08/26/2022   HIV Screening  11/22/2024 (Originally 01/21/1975)   DTaP/Tdap/Td (3 - Td or Tdap) 11/04/2028   INFLUENZA VACCINE  Completed   COVID-19 Vaccine  Completed   Hepatitis C Screening  Completed   Zoster Vaccines- Shingrix  Completed   HPV VACCINES  Aged Out    Patient Care Team: Ronnald Nian, MD as PCP - General (Family Medicine)  Dentist-Dr. Katrinka Blazing in [place] [facility]-Dr.Pyrte Derm[facility] Periodontist-Dr.Byerly   Outpatient Medications Prior to Visit  Medication Sig Note   aspirin 81 MG tablet Take 81 mg by mouth daily.    Multiple Vitamin (MULTIVITAMIN) tablet Take 1 tablet by mouth daily.    ondansetron (ZOFRAN ODT) 4 MG disintegrating tablet Take 1 tablet (4 mg total) by mouth every 8 (eight) hours as needed for nausea or vomiting. (Patient not taking: Reported on 01/07/2024)    [DISCONTINUED] finasteride (PROSCAR) 5 MG tablet TAKE 1 TABLET BY MOUTH DAILY    [DISCONTINUED] losartan-hydrochlorothiazide (HYZAAR) 100-12.5 MG tablet TAKE 1 TABLET BY MOUTH DAILY    [DISCONTINUED] promethazine (PHENERGAN) 25 MG tablet TAKE ONE TABLET BY MOUTH EVERY 8 HOURS AS NEEDED FOR NAUSEA AND VOMITING (Patient not taking: Reported on 01/07/2024)    [DISCONTINUED] SUMAtriptan (IMITREX) 100 MG tablet TAKE 1 TABLET BY MOUTH AT ONSET OF MIGRAINE; MAY REPEAT 1 TABLET  IN 2 HOURS IF NEEDED. 01/07/2024: Took one yesterday   [DISCONTINUED] tamsulosin (FLOMAX) 0.4 MG CAPS capsule TAKE 1 CAPSULE BY MOUTH DAILY    No facility-administered medications prior to visit.  The 10-year ASCVD risk score (Arnett DK, et al., 2019) is: 11.1%   Values used to calculate the score:     Age: 89 years     Sex: Male     Is Non-Hispanic African American: No     Diabetic: No     Tobacco smoker: No     Systolic Blood Pressure: 140 mmHg     Is BP treated: Yes     HDL Cholesterol: 75 mg/dL     Total Cholesterol: 190 mg/dL   Review of Systems  All  other systems reviewed and are negative.   Family and social history as well as health maintenance and immunizations was reviewed.     Objective:    BP (!) 140/80   Pulse 77   Ht 5\' 9"  (1.753 m)   Wt 157 lb 3.2 oz (71.3 kg)   SpO2 99%   BMI 23.21 kg/m    Physical Exam  Alert and in no distress. Tympanic membranes and canals are normal. Pharyngeal area is normal. Neck is supple without adenopathy or thyromegaly. Cardiac exam shows a regular sinus rhythm without murmurs or gallops. Lungs are clear to auscultation.      Assessment & Plan:     Routine general medical examination at a health care facility  Seasonal allergic rhinitis due to pollen  Arthritis  Benign prostatic hyperplasia without lower urinary tract symptoms  Essential hypertension, benign - Plan: losartan-hydrochlorothiazide (HYZAAR) 100-12.5 MG tablet  Benign prostatic hyperplasia, unspecified whether lower urinary tract symptoms present - Plan: finasteride (PROSCAR) 5 MG tablet, tamsulosin (FLOMAX) 0.4 MG CAPS capsule  Migraine with aura and without status migrainosus, not intractable - Plan: SUMAtriptan (IMITREX) 100 MG tablet, CBC with Differential/Platelet, Comprehensive metabolic panel, promethazine (PHENERGAN) 25 MG tablet  Screening for colon cancer - Plan: Cologuard  Screening for lipid disorders - Plan: Lipid panel  Continue on present medication regimen.  Discussed treatment of arthritis in regard to symptom relief with Tylenol first and then adding an NSAID if needed.  Otherwise he will continue on his present medication regimen and his present Follow-up 1 year     Sharlot Gowda, MD

## 2024-01-07 NOTE — Telephone Encounter (Signed)
Pharmacy Patient Advocate Encounter  Insurance verification completed.   The patient is insured through [facility]   Ran test claim for Tamsulosin HCl 0.4MG  capsules. Currently a quantity of 90 is a 90 day supply and the co-pay is RTS AND WAS LAST FILLED ON 12.9.24 FOR A 90 DAY SUPPLY       This test claim was processed through [facility]- copay amounts may vary at other pharmacies due to pharmacy/plan contracts, or as the patient moves through the different stages of their insurance plan.

## 2024-01-08 ENCOUNTER — Encounter: Payer: Self-pay | Admitting: Family Medicine

## 2024-01-08 LAB — LIPID PANEL
Cholesterol, Total: 190 mg/dL (ref 100–199)
HDL: 75 mg/dL (ref >39)
LDL CALC COMMENT:: 2.5 ratio (ref 0.0–5.0)
LDL Chol Calc (NIH): 105 mg/dL — ABNORMAL HIGH (ref 0–99)
Triglycerides: 54 mg/dL (ref 0–149)
VLDL Cholesterol Cal: 10 mg/dL (ref 5–40)

## 2024-01-08 LAB — CBC WITH DIFFERENTIAL/PLATELET
Basophils Absolute: 0 10*3/uL (ref 0.0–0.2)
Basos: 1 %
EOS (ABSOLUTE): 0.1 10*3/uL (ref 0.0–0.4)
Eos: 3 %
Hematocrit: 44.3 % (ref 37.5–51.0)
Hemoglobin: 15.3 g/dL (ref 13.0–17.7)
Immature Grans (Abs): 0 10*3/uL (ref 0.0–0.1)
Immature Granulocytes: 0 %
Lymphocytes Absolute: 1.1 10*3/uL (ref 0.7–3.1)
Lymphs: 27 %
MCH: 32.7 pg (ref 26.6–33.0)
MCHC: 34.5 g/dL (ref 31.5–35.7)
MCV: 95 fL (ref 79–97)
Monocytes Absolute: 0.4 10*3/uL (ref 0.1–0.9)
Monocytes: 9 %
Neutrophils Absolute: 2.4 10*3/uL (ref 1.4–7.0)
Neutrophils: 60 %
Platelets: 176 10*3/uL (ref 150–450)
RBC: 4.68 x10E6/uL (ref 4.14–5.80)
RDW: 11.2 % — ABNORMAL LOW (ref 11.6–15.4)
WBC: 4 10*3/uL (ref 3.4–10.8)

## 2024-01-08 LAB — COMPREHENSIVE METABOLIC PANEL
ALT: 18 [IU]/L (ref 0–44)
AST: 21 [IU]/L (ref 0–40)
Albumin: 4.6 g/dL (ref 3.9–4.9)
Alkaline Phosphatase: 65 IU/L (ref 44–121)
BUN/Creatinine Ratio: 17 (ref 10–24)
BUN: 17 mg/dL (ref 8–27)
Bilirubin Total: 0.8 mg/dL (ref 0.0–1.2)
CO2: 26 mmol/L (ref 20–29)
Calcium: 9.6 mg/dL (ref 8.6–10.2)
Chloride: 101 mmol/L (ref 96–106)
Creatinine, Ser: 1.02 mg/dL (ref 0.76–1.27)
Globulin, Total: 1.8 g/dL (ref 1.5–4.5)
Glucose: 88 mg/dL (ref 70–99)
Potassium: 4.2 mmol/L (ref 3.5–5.2)
Sodium: 143 mmol/L (ref 134–144)
Total Protein: 6.4 g/dL (ref 6.0–8.5)
eGFR: 83 mL/min/{1.73_m2} (ref >59)

## 2024-01-19 ENCOUNTER — Encounter: Payer: Self-pay | Admitting: Internal Medicine

## 2024-03-10 ENCOUNTER — Encounter: Payer: Self-pay | Admitting: Family Medicine

## 2024-03-10 LAB — COLOGUARD: COLOGUARD: NEGATIVE

## 2024-05-12 ENCOUNTER — Other Ambulatory Visit: Payer: Self-pay | Admitting: Family Medicine

## 2024-05-12 DIAGNOSIS — G43109 Migraine with aura, not intractable, without status migrainosus: Secondary | ICD-10-CM

## 2024-06-30 ENCOUNTER — Ambulatory Visit: Admitting: Family Medicine

## 2024-06-30 ENCOUNTER — Encounter: Payer: Self-pay | Admitting: Family Medicine

## 2024-06-30 VITALS — BP 140/80 | HR 80 | Temp 98.8°F | Ht 69.0 in | Wt 162.8 lb

## 2024-06-30 DIAGNOSIS — R109 Unspecified abdominal pain: Secondary | ICD-10-CM | POA: Diagnosis not present

## 2024-06-30 DIAGNOSIS — R8290 Unspecified abnormal findings in urine: Secondary | ICD-10-CM

## 2024-06-30 LAB — POCT URINALYSIS DIP (PROADVANTAGE DEVICE)
Bilirubin, UA: NEGATIVE
Blood, UA: NEGATIVE
Glucose, UA: NEGATIVE mg/dL
Ketones, POC UA: NEGATIVE mg/dL
Nitrite, UA: NEGATIVE
Protein Ur, POC: NEGATIVE mg/dL
Specific Gravity, Urine: 1.015
Urobilinogen, Ur: 0.2
pH, UA: 6 (ref 5.0–8.0)

## 2024-06-30 NOTE — Progress Notes (Signed)
 Chief Complaint  Patient presents with   Abdominal Pain    Abdominal pain x 2 weeks. Tripped over coffee table and thinks he may have pulled something in the pivot. Pain is all over, not on either side. Some tingling when he urinates.    2 weeks ago he pivoted during a fall/trip.  Twisted and pulled something, had acute onset of discomfort. Normally this would get better with heat, tylenol, aleve, but pain has persisted. Taking 1 pill of aleve twice daily. It got about 30% better, but no further improvement.  Pain is at lower abdomen, suprapubic area, described as a pressure or slight tingling, not sharp pain.  H/o hernia repair 15 years ago, bilateral  He denies any bulging/swelling or pain similar to prior hernia.    PMH, PSH, SH reviewed  BPH, HTN, migraines  Outpatient Encounter Medications as of 06/30/2024  Medication Sig Note   acetaminophen (TYLENOL) 500 MG tablet Take 1,000 mg by mouth every 6 (six) hours as needed. 06/30/2024: Took 1000mg  tylenol at 6am   aspirin 81 MG tablet Take 81 mg by mouth daily.    finasteride  (PROSCAR ) 5 MG tablet Take 1 tablet (5 mg total) by mouth daily.    losartan -hydrochlorothiazide  (HYZAAR) 100-12.5 MG tablet Take 1 tablet by mouth daily.    Multiple Vitamin (MULTIVITAMIN) tablet Take 1 tablet by mouth daily.    naproxen sodium (ALEVE) 220 MG tablet Take 220 mg by mouth. 06/30/2024: Took one at 8am   tamsulosin  (FLOMAX ) 0.4 MG CAPS capsule Take 1 capsule (0.4 mg total) by mouth daily.    ondansetron  (ZOFRAN  ODT) 4 MG disintegrating tablet Take 1 tablet (4 mg total) by mouth every 8 (eight) hours as needed for nausea or vomiting. (Patient not taking: Reported on 06/30/2024) 06/30/2024: As needed for migraine   promethazine  (PHENERGAN ) 25 MG tablet Take 1 tablet (25 mg total) by mouth every 6 (six) hours as needed for nausea or vomiting. (Patient not taking: Reported on 06/30/2024) 06/30/2024: As needed for migraine   SUMAtriptan  (IMITREX ) 100 MG tablet TAKE 1  TABLET BY MOUTH AT ONSET OF MIGRAINE; MAY REPEAT 1 TABLET IN 2 HOURS IF NEEDED. (Patient not taking: Reported on 06/30/2024) 06/30/2024: Took one 3 days ago   No facility-administered encounter medications on file as of 06/30/2024.   Allergies  Allergen Reactions   Codeine Nausea And Vomiting   Percogesic [Diphenhydramine-Apap (Sleep)]     palpitations    ROS: no f/c, URI symptoms, CP, SOB. No n/v/d. Feels like he is emptying his bladder well. No hematuria. Has slight tingling with voiding, slight urgency. Denies multiple sexual partners or STD risks.   PHYSICAL EXAM:  BP (!) 140/80 (BP Location: Left Arm, Cuff Size: Normal)   Pulse 80   Temp 98.8 F (37.1 C) (Tympanic)   Ht 5' 9 (1.753 m)   Wt 162 lb 12.8 oz (73.8 kg)   BMI 24.04 kg/m   Well-appearing, pleasant male, in no distress HEENT: conjunctiva and sclera are clear, EOMI Neck: no lymphadenopathy or mass Heart: regular rate and rhythm, no murmur Lungs: clear bilaterally Back: no CVA tenderness Abdomen: soft, normal bowel sounds.  Nontender. No organomegaly or mass. No pain with various position changes (sit up, twists, hip flexion, etc) WHSS bilateral lower abdomen.  Nontender. No bulging with contraction of abdominal muscles GU: no lesions. Testicles are normal.  No hernias or tenderness in inguinal canal. No inguinal lymphadenopathy Rectal exam declined (d/t lack of prostate symptoms) Extremities: no edema, normal pulses  Skin: no visible rashes (small bruise R thigh), normal turgor Neuro: alert and oriented, cranial nerves intact Psych: normal mood, affect, hygiene and grooming   Urine dip: clear, SG 1.015, 1+ leuks, negative nitrite, blood, protein   ASSESSMENT/PLAN:  Abdominal pain, unspecified abdominal location - started after pivot/twisting.  30% better, but no further improvement.  Heat, increase aleve to 2 BID for up to 2 weeks - Plan: POCT Urinalysis DIP (Proadvantage Device), Urine Culture  Abnormal  urinalysis - no urinary symptoms, but given vague persistent symptoms and leuks on u/a, will send culture - Plan: Urine Culture  Your exam was normal. I couldn't reproduce your discomfort to say exactly what is causing it. There is no evidence of a recurrent hernia. There was some white cells in the urine, so we are sending it for culture, to ensure no infection.  You can continue to use heat, and some gentle stretches. Avoid activity that increase pain. We discussed increasing the anti-inflammatory to closer to a prescription dose . Take 2 tablets (total of 440 mg) of naproxen/aleve twice daily with food. Take this regularly for up to 2 weeks. You may stop when pain has resolved.  Return for re-evaluation if your pain persists or worsens.  I spent 36 minutes dedicated to the care of this patient, including pre-visit review of records, face to face time, post-visit ordering of testing and documentation.

## 2024-06-30 NOTE — Patient Instructions (Signed)
  Your exam was normal. I couldn't reproduce your discomfort to say exactly what is causing it. There is no evidence of a recurrent hernia. There was some white cells in the urine, so we are sending it for culture, to ensure no infection.  You can continue to use heat, and some gentle stretches. Avoid activity that increase pain. We discussed increasing the anti-inflammatory to closer to a prescription dose . Take 2 tablets (total of 440 mg) of naproxen/aleve twice daily with food. Take this regularly for up to 2 weeks. You may stop when pain has resolved.  Return for re-evaluation if your pain persists or worsens.

## 2024-07-02 ENCOUNTER — Ambulatory Visit: Payer: Self-pay | Admitting: Family Medicine

## 2024-07-02 LAB — URINE CULTURE: Organism ID, Bacteria: NO GROWTH

## 2024-07-06 ENCOUNTER — Ambulatory Visit: Admitting: Family Medicine

## 2024-07-27 ENCOUNTER — Other Ambulatory Visit: Payer: Self-pay | Admitting: Family Medicine

## 2024-07-27 DIAGNOSIS — G43109 Migraine with aura, not intractable, without status migrainosus: Secondary | ICD-10-CM

## 2024-08-19 ENCOUNTER — Ambulatory Visit: Payer: Self-pay

## 2024-08-19 NOTE — Telephone Encounter (Signed)
 No appointment left at [facility]. You can see If he wants to be seen at [facility] office or [place] office versus being seen here next week as scheduled

## 2024-08-19 NOTE — Telephone Encounter (Addendum)
 FYI Only or Action Required?: FYI only for provider.  Patient was last seen in primary care on 06/30/2024 by Randol Dawes, MD.  Called Nurse Triage reporting prostate pain.  Symptoms began several weeks ago.  Interventions attempted: OTC medications: Naproxen and Ice/heat application.  Symptoms are: gradually worsening.  Triage Disposition: See PCP Within 2 Weeks  Patient/caregiver understands and will follow disposition?: Yes  Copied from CRM #[id number]. Topic: Clinical - Red Word Triage >> Aug 19, 2024  9:18 AM Delon HERO wrote: Red Word that prompted transfer to Nurse Triage: Patient's wife is calling to report that the patient is in pain in lower abdomen with prostate pain. Reason for Disposition  Urination is difficult to start (i.e., hesitancy) or straining  Answer Assessment - Initial Assessment Questions 1. SYMPTOM: What's the main symptom you're concerned about? (e.g., frequency, incontinence)     Prostate pain and urinary discomfort  2. ONSET: When did the  discomfort  start?     Pain in the groin and prostate started 6 weeks agao, pt endorsing urinary discomfort x 1 week  3. PAIN: Is there any pain? If Yes, ask: How bad is it? (Scale: 1-10; mild, moderate, severe)     Mild 4/10 pain  4. CAUSE: What do you think is causing the symptoms?     Pt endorses history of prostatitis, thinks that may be the cause  5. OTHER SYMPTOMS: Do you have any other symptoms? (e.g., blood in urine, fever, flank pain, pain with urination)     No  Protocols used: Urinary Symptoms-A-AH     Answer Assessment - Initial Assessment Questions 1. MECHANISM: How did the injury happen? (e.g., twisting injury, direct blow)      *No Answer* 2. ONSET: When did the injury happen? (e.g., minutes, hours ago)      *No Answer* 3. LOCATION: Where is the injury located?      Prostatities   4. APPEARANCE of INJURY: What does the injury look like?  (e.g., looks normal; bruise,  swelling)     *No Answer* 5. PAIN: Is there pain? If Yes, ask: How bad is the pain?   What does it keep you from doing? (Scale 0-10; or none, mild, moderate, severe)     *No Answer* 6. SIZE: For cuts, bruises, or swelling, ask: How large is it? (e.g., inches or centimeters;  entire joint)      *No Answer* 7. TETANUS: For any breaks in the skin, ask: When was your last tetanus booster?     *No Answer* 8. OTHER SYMPTOMS: Do you have any other symptoms?      *No Answer* 9. PREGNANCY: Is there any chance you are pregnant? When was your last menstrual period?     *No Answer*  Protocols used: Groin Injury and Strain-A-AH

## 2024-08-19 NOTE — Telephone Encounter (Signed)
 Reason for Disposition . Urination is difficult to start (i.e., hesitancy) or straining  Answer Assessment - Initial Assessment Questions 1. SYMPTOM: What's the main symptom you're concerned about? (e.g., frequency, incontinence)     Prostate pain and urinary discomfort  2. ONSET: When did the  discomfort  start?     Pain in the groin and prostate started 6 weeks agao, pt endorsing urinary discomfort x 1 week  3. PAIN: Is there any pain? If Yes, ask: How bad is it? (Scale: 1-10; mild, moderate, severe)     Mild 4/10 pain  4. CAUSE: What do you think is causing the symptoms?     Pt endorses history of prostatitis, thinks that may be the cause  5. OTHER SYMPTOMS: Do you have any other symptoms? (e.g., blood in urine, fever, flank pain, pain with urination)     No  Protocols used: Urinary Symptoms-A-AH

## 2024-08-23 ENCOUNTER — Encounter: Payer: Self-pay | Admitting: Family Medicine

## 2024-08-23 ENCOUNTER — Ambulatory Visit: Admitting: Family Medicine

## 2024-08-23 VITALS — BP 154/88 | HR 76 | Ht 70.0 in | Wt 161.0 lb

## 2024-08-23 DIAGNOSIS — I10 Essential (primary) hypertension: Secondary | ICD-10-CM | POA: Diagnosis not present

## 2024-08-23 DIAGNOSIS — N410 Acute prostatitis: Secondary | ICD-10-CM | POA: Diagnosis not present

## 2024-08-23 DIAGNOSIS — N4281 Prostatodynia syndrome: Secondary | ICD-10-CM | POA: Diagnosis not present

## 2024-08-23 LAB — POCT URINALYSIS DIP (PROADVANTAGE DEVICE)
Bilirubin, UA: NEGATIVE
Blood, UA: NEGATIVE
Glucose, UA: NEGATIVE mg/dL
Ketones, POC UA: NEGATIVE mg/dL
Leukocytes, UA: NEGATIVE
Nitrite, UA: NEGATIVE
Protein Ur, POC: NEGATIVE mg/dL
Specific Gravity, Urine: 1.01
Urobilinogen, Ur: 0.2
pH, UA: 6 (ref 5.0–8.0)

## 2024-08-23 MED ORDER — SULFAMETHOXAZOLE-TRIMETHOPRIM 800-160 MG PO TABS: 1.0000 | ORAL_TABLET | Freq: Two times a day (BID) | ORAL | 0 refills | Status: DC

## 2024-08-23 NOTE — Patient Instructions (Signed)
 Take 2 Aleve twice per day and also the antibiotic twice per day for the next couple weeks Vascepa W, [place] on treating the possible infection and inflammation

## 2024-08-23 NOTE — Progress Notes (Addendum)
   Subjective:    Patient ID: Tony Hester, male    DOB: [birth year], 63 y.o.   MRN: [id number]  Discussed the use of AI scribe software for clinical note transcription with the patient, who gave verbal consent to proceed.  History of Present Illness   Tony Hester is a 63 year old male who presents with groin discomfort and urinary symptoms.  He has been experiencing soreness and a sensation of swelling in the groin area, primarily on one side, occasionally affecting the other side and the middle. The symptoms began approximately two months ago. The discomfort is described as obnoxious, particularly when sitting or using the computer, as it worsens with hip flexion. Walking and standing are not as bothersome, and he does not feel the discomfort when lying down.  He notes a tingling sensation that sometimes prompts urination. This tingling is slightly present during urination but eases off afterward. No nausea, vomiting, changes in bowel habits, urinary leakage, or discharge. He also reports no recent sexual activity.  His past medical history includes bilateral hernia repair approximately twenty years ago. He has been taking Aleve, with the last dose taken this morning.           Review of Systems     Objective:    Physical Exam Physical Exam   GENITOURINARY: Penis and testes are normal.  No hernia present.   RECTAL: Prostate slightly more mushy than normal.                Assessment & Plan:  Urinary symptoms with possible prostatic inflammation Urinary urgency and tingling suggest possible low-grade prostatic inflammation or infection.  I will treat with Septra  as well as 2 Aleve twice per day for the next 2 weeks and see how he does. Discussed immunizations however he will come at a later date for that. Acute prostatitis - Plan: sulfamethoxazole -trimethoprim  (BACTRIM  DS) 800-160 MG tablet  Essential hypertension, benign He is to return here in 1 month for blood pressure  recheck and bring his blood pressure cuff with him.

## 2024-08-23 NOTE — Addendum Note (Signed)
 Addended by: JOHNNYE JON SQUIBB on: 08/23/2024 03:19 PM   Modules accepted: Orders

## 2024-08-31 ENCOUNTER — Other Ambulatory Visit: Payer: Self-pay | Admitting: Internal Medicine

## 2024-08-31 ENCOUNTER — Telehealth: Payer: Self-pay | Admitting: Family Medicine

## 2024-08-31 ENCOUNTER — Ambulatory Visit: Payer: Self-pay

## 2024-08-31 DIAGNOSIS — N410 Acute prostatitis: Secondary | ICD-10-CM

## 2024-08-31 MED ORDER — SULFAMETHOXAZOLE-TRIMETHOPRIM 800-160 MG PO TABS: 1.0000 | ORAL_TABLET | Freq: Two times a day (BID) | ORAL | 0 refills | Status: DC

## 2024-08-31 MED ORDER — SULFAMETHOXAZOLE-TRIMETHOPRIM 800-160 MG PO TABS
1.0000 | ORAL_TABLET | Freq: Two times a day (BID) | ORAL | 0 refills | Status: DC
Start: 2024-08-31 — End: 2024-09-20

## 2024-08-31 NOTE — Telephone Encounter (Signed)
 FYI Only or Action Required?: Action required by provider: clinical question for provider.  Tony Hester was last seen in primary care on 08/23/2024 by Tony Norleen BROCKS, MD.  Called Nurse Triage reporting Follow-up.  Symptoms began several months ago.  Interventions attempted: Prescription medications: bactrim .  Symptoms are: gradually improving.  Triage Disposition: Discuss With PCP and Callback by Nurse Today (overriding See PCP When Office is Open (Within 3 Days))  Tony Hester/caregiver understands and will follow disposition?:   Reason for Disposition  [1] Finished taking antibiotics AND [2] symptoms are BETTER but [3] not completely gone  Answer Assessment - Initial Assessment Questions Tony Hester was rx Bactrim  08/23/24 for prostitis. States pain improved from a 4/10 to a 2/10, is asking if antibiotics should be extended. Has one day left.  Advised to call back if pain worsens or if he develops fever or dysuria. Advised to go to the ED if he is unable to urinate.  1. INFECTION: What infection is the antibiotic being given for?     Prostitis  2. ANTIBIOTIC: What antibiotic are you taking How many times per day?     Bactrim   3. DURATION: When was the antibiotic started?     08/23/24, has one day left  4. MAIN CONCERN OR SYMPTOM:  What is your main concern right now?     Pain is improved, but not resolved  5. BETTER-SAME-WORSE: Are you getting better, staying the same, or getting worse compared to when you first started the antibiotics? If getting worse, ask: In what way?      Improving  6. FEVER: Do you have a fever? If Yes, ask: What is your temperature, how was it measured, and when did it start?     Denies  7. SYMPTOMS: Are there any other symptoms you're concerned about? If Yes, ask: When did it start?     Pain not completely resolved  Protocols used: Infection on Antibiotic Follow-up Call-A-AH

## 2024-08-31 NOTE — Telephone Encounter (Signed)
 1st attempt---left a voicemail for call back about prescription refill request

## 2024-08-31 NOTE — Telephone Encounter (Signed)
 2nd attempt to contact patient to review medication request for medication . Last OV 08/23/24 and prescribed Bactrim  DS. Patient requesting refill. No answer to review sx. LVMTCB [phone].

## 2024-08-31 NOTE — Telephone Encounter (Unsigned)
 Copied from CRM [id number]. Topic: Clinical - Medication Refill >> Aug 31, 2024  9:56 AM Charlet HERO wrote: Medication: sulfamethoxazole -trimethoprim  (BACTRIM  DS) 800-160 MG tablet  Has the patient contacted their pharmacy? Yes 0 refills  This is the patient's preferred pharmacy:  [id number] [place], [place] - [address] [place] [address] Phone: [phone] Fax: [phone]  Is this the correct pharmacy for this prescription? Yes If no, delete pharmacy and type the correct one.   Has the prescription been filled recently? Yes  Is the patient out of the medication? Yes  Has the patient been seen for an appointment in the last year OR does the patient have an upcoming appointment? Yes  Can we respond through MyChart? Yes  Agent: Please be advised that Rx refills may take up to 3 business days. We ask that you follow-up with your pharmacy.

## 2024-08-31 NOTE — Addendum Note (Signed)
 Addended by: [name] on: 08/31/2024 02:13 PM   Modules accepted: Orders

## 2024-08-31 NOTE — Telephone Encounter (Signed)
 Patient wanting to know if you think he needs more antibiotics.

## 2024-09-16 ENCOUNTER — Ambulatory Visit: Payer: Self-pay

## 2024-09-16 NOTE — Telephone Encounter (Signed)
 This RN made second attempt to triage patient. No answer, LVM. Routing for additional attempts.

## 2024-09-16 NOTE — Telephone Encounter (Signed)
 This RN made first attempt to triage patient. No answer, LVM. Routing for additional attempts.   Copied from CRM #[id number]. Topic: Clinical - Medication Question >> Sep 16, 2024  9:38 AM Tony Hester wrote: Reason for CRM: Pt would like to know if he needs to be switched to a different medication due to his symptoms for prostatitis coming back. Pt stated it was fine for the first 20 days but now he feels as if its coming back. Pt was prescribed sulfamethoxazole -trimethoprim  (BACTRIM  DS) 800-160 MG tablet for this issue. Informed pt someone will give him a call shortly.

## 2024-09-16 NOTE — Telephone Encounter (Signed)
 This RN made third attempt to triage patient. No answer, LVM for patient to return call to [phone] Routing to [facility] for follow up.

## 2024-09-19 ENCOUNTER — Telehealth: Payer: Self-pay

## 2024-09-19 ENCOUNTER — Encounter: Payer: Self-pay | Admitting: Family Medicine

## 2024-09-19 DIAGNOSIS — N410 Acute prostatitis: Secondary | ICD-10-CM

## 2024-09-19 NOTE — Telephone Encounter (Signed)
 Copied from CRM [id number]. Topic: General - Other >> Sep 19, 2024  9:15 AM Jasmin G wrote: Reason for CRM: Pt called regarding recent missed call on 10/24, please refer to recent Encounters for more info. Call pt back at [phone] to follow up.  I called Patient and no answer. He has an appt on 09-22-24

## 2024-09-20 MED ORDER — SULFAMETHOXAZOLE-TRIMETHOPRIM 800-160 MG PO TABS: 1.0000 | ORAL_TABLET | Freq: Two times a day (BID) | ORAL | 0 refills | Status: DC

## 2024-09-22 ENCOUNTER — Ambulatory Visit: Admitting: Family Medicine

## 2024-09-22 ENCOUNTER — Encounter: Payer: Self-pay | Admitting: Family Medicine

## 2024-09-22 VITALS — BP 146/82 | HR 79 | Ht 70.0 in | Wt 162.0 lb

## 2024-09-22 DIAGNOSIS — Z23 Encounter for immunization: Secondary | ICD-10-CM

## 2024-09-22 DIAGNOSIS — I10 Essential (primary) hypertension: Secondary | ICD-10-CM | POA: Diagnosis not present

## 2024-09-22 NOTE — Progress Notes (Signed)
   Subjective:    Patient ID: Tony Hester, male    DOB: 1961-11-11, 63 y.o.   MRN: [id number]  HPI He is here for follow-up visit.  He was supposed to have a brought his blood pressure cuff him but he forgot to do that.  He also would like some immunizations given.   Review of Systems     Objective:    Physical Exam Alert and in no distress.  Blood pressure is recorded and does show the systolic to be elevated.       Assessment & Plan:  Essential hypertension, benign  Need for influenza vaccination - Plan: Flu vaccine trivalent PF, 6mos and older(Flulaval,Afluria,Fluarix,Fluzone)  Need for COVID-19 vaccine - Plan: Pfizer Comirnaty Covid-19 Vaccine 70yrs & older He will work with Jon to have him come back with his blood pressure cuff to measure to see how accurate results.  Based on that we will try to get better control of his blood pressure.  I explained that more likely than not we will add another medication to his regimen to help get it under control.  He recognizes that he has not been exercising recently because of the difficulty with his prostate causing some discomfort and interfering with exercise

## 2024-09-29 ENCOUNTER — Encounter: Payer: Self-pay | Admitting: Family Medicine

## 2024-09-29 DIAGNOSIS — N410 Acute prostatitis: Secondary | ICD-10-CM

## 2024-09-29 MED ORDER — SULFAMETHOXAZOLE-TRIMETHOPRIM 800-160 MG PO TABS
1.0000 | ORAL_TABLET | Freq: Two times a day (BID) | ORAL | 0 refills | Status: AC
Start: 2024-09-29 — End: ?

## 2024-09-30 ENCOUNTER — Other Ambulatory Visit

## 2024-10-05 ENCOUNTER — Telehealth: Payer: Self-pay

## 2024-10-05 ENCOUNTER — Other Ambulatory Visit

## 2024-10-05 DIAGNOSIS — I10 Essential (primary) hypertension: Secondary | ICD-10-CM

## 2024-10-05 MED ORDER — AMLODIPINE BESYLATE 5 MG PO TABS: 5.0000 mg | ORAL_TABLET | Freq: Every day | ORAL | 3 refills | Status: AC

## 2024-10-05 NOTE — Telephone Encounter (Signed)
 His blood pressure cuff at home is accurate.  I will add amlodipine to his regimen.  He is to leave a message on MyChart in a month as to how he is doing.

## 2024-10-05 NOTE — Telephone Encounter (Signed)
 Patient came in for B/P cuff calibration. It was correct His B/P are consistently in 150-100 range.

## 2024-10-12 MED ORDER — LEVOFLOXACIN 500 MG PO TABS: 500.0000 mg | ORAL_TABLET | Freq: Every day | ORAL | 1 refills | Status: AC

## 2024-10-12 NOTE — Addendum Note (Signed)
 Addended by: [name] on: 10/12/2024 03:59 PM   Modules accepted: Orders

## 2024-10-13 ENCOUNTER — Ambulatory Visit: Admitting: Podiatry

## 2024-10-13 DIAGNOSIS — S90121A Contusion of right lesser toe(s) without damage to nail, initial encounter: Secondary | ICD-10-CM | POA: Diagnosis not present

## 2024-10-13 NOTE — Progress Notes (Signed)
 Subjective:  Patient ID: Tony Hester, male    DOB: Aug 13, 1961,  MRN: [id number]  Chief Complaint  Patient presents with   Nail Problem    Injury to nail     63 y.o. male presents with the above complaint.  Patient presents with right second digit toenail contusion.  He states he hit it against the furniture.  Just wanted get it evaluated.  He takes baby aspirin.  He denies any other acute complaints pain scale is 0 out of 10 does not bother him does not cause him discomfort he just wants to make sure is healing okay.   Review of Systems: Negative except as noted in the HPI. Denies N/V/F/Ch.  Past Medical History:  Diagnosis Date   Eczema    Enlarged prostate    Hypertension    Migraine headache     Current Outpatient Medications:    acetaminophen (TYLENOL) 500 MG tablet, Take 1,000 mg by mouth every 6 (six) hours as needed., Disp: , Rfl:    amLODipine (NORVASC) 5 MG tablet, Take 1 tablet (5 mg total) by mouth daily., Disp: 90 tablet, Rfl: 3   aspirin 81 MG tablet, Take 81 mg by mouth daily., Disp: , Rfl:    finasteride  (PROSCAR ) 5 MG tablet, Take 1 tablet (5 mg total) by mouth daily., Disp: 90 tablet, Rfl: 3   levofloxacin (LEVAQUIN) 500 MG tablet, Take 1 tablet (500 mg total) by mouth daily for 7 days., Disp: 14 tablet, Rfl: 1   losartan -hydrochlorothiazide  (HYZAAR) 100-12.5 MG tablet, Take 1 tablet by mouth daily., Disp: 90 tablet, Rfl: 3   Multiple Vitamin (MULTIVITAMIN) tablet, Take 1 tablet by mouth daily., Disp: , Rfl:    naproxen sodium (ALEVE) 220 MG tablet, Take 220 mg by mouth., Disp: , Rfl:    ondansetron  (ZOFRAN  ODT) 4 MG disintegrating tablet, Take 1 tablet (4 mg total) by mouth every 8 (eight) hours as needed for nausea or vomiting., Disp: 10 tablet, Rfl: 0   promethazine  (PHENERGAN ) 25 MG tablet, Take 1 tablet (25 mg total) by mouth every 6 (six) hours as needed for nausea or vomiting., Disp: 20 tablet, Rfl: 0   sulfamethoxazole -trimethoprim  (BACTRIM  DS) 800-160 MG  tablet, Take 1 tablet by mouth 2 (two) times daily., Disp: 28 tablet, Rfl: 0   SUMAtriptan  (IMITREX ) 100 MG tablet, TAKE 1 TABLET BY MOUTH AT ONSET OF MIGRAINE; MAY REPEAT 1 TABLET IN 2 HOURS IF NEEDED., Disp: 9 tablet, Rfl: 1   tamsulosin  (FLOMAX ) 0.4 MG CAPS capsule, Take 1 capsule (0.4 mg total) by mouth daily., Disp: 90 capsule, Rfl: 3  Social History   Tobacco Use  Smoking Status Never  Smokeless Tobacco Never    Allergies  Allergen Reactions   Codeine Nausea And Vomiting   Percogesic [Diphenhydramine-Acetaminophen]     palpitations   Objective:  There were no vitals filed for this visit. There is no height or weight on file to calculate BMI. Constitutional Well developed. Well nourished.  Vascular Dorsalis pedis pulses palpable bilaterally. Posterior tibial pulses palpable bilaterally. Capillary refill normal to all digits.  No cyanosis or clubbing noted. Pedal hair growth normal.  Neurologic Normal speech. Oriented to person, place, and time. Epicritic sensation to light touch grossly present bilaterally.  Dermatologic Right second digit toe contusion with less than 10% hematoma involvement.  The nail is well adhered to the underlying nailbed.  No infection noted.  No other abnormalities identified  Orthopedic: Normal joint ROM without pain or crepitus bilaterally. No visible deformities. No  bony tenderness.   Radiographs: None Assessment:   1. Contusion of second toe of right foot, initial encounter    Plan:  Patient was evaluated and treated and all questions answered.  Right second digit toe contusion without damage to the nail - All questions and concerns were discussed with the patient in extensive detail.  At this time the nail is well adhered to the underlying nailbed no signs of loosening or breaking noted of the nail.  Less than 10% hematoma involvement.  I believe patient will benefit from clinically monitoring the nail if it regresses or gets worse he  will come back and see me.   No follow-ups on file.

## 2024-10-24 ENCOUNTER — Other Ambulatory Visit: Payer: Self-pay | Admitting: Family Medicine

## 2024-10-24 DIAGNOSIS — G43109 Migraine with aura, not intractable, without status migrainosus: Secondary | ICD-10-CM

## 2024-10-31 NOTE — Addendum Note (Signed)
 Addended by: [name] on: 10/31/2024 02:46 PM   Modules accepted: Orders

## 2025-02-09 ENCOUNTER — Ambulatory Visit: Payer: 59 | Admitting: Family Medicine

## 2025-02-22 ENCOUNTER — Ambulatory Visit: Payer: Self-pay | Admitting: Family Medicine
